# Patient Record
Sex: Female | Born: 1945 | Race: Black or African American | Hispanic: No | Marital: Single | Smoking: Never smoker
Health system: Southern US, Community
[De-identification: ages and names within clinical notes are randomized; demographics above are authoritative.]

## PROBLEM LIST (undated history)

## (undated) DIAGNOSIS — M869 Osteomyelitis, unspecified: Secondary | ICD-10-CM

## (undated) DIAGNOSIS — E079 Disorder of thyroid, unspecified: Secondary | ICD-10-CM

## (undated) HISTORY — PX: GASTRIC BYPASS: SHX52

## (undated) HISTORY — PX: FRACTURE SURGERY: SHX138

## (undated) HISTORY — PX: TUBAL LIGATION: SHX77

---

## 2002-01-13 ENCOUNTER — Encounter: Admission: RE | Admit: 2002-01-13 | Discharge: 2002-04-13 | Payer: Self-pay

## 2002-04-24 ENCOUNTER — Encounter: Admission: RE | Admit: 2002-04-24 | Discharge: 2002-07-23 | Payer: Self-pay

## 2002-08-14 ENCOUNTER — Encounter: Admission: RE | Admit: 2002-08-14 | Discharge: 2002-11-12 | Payer: Self-pay

## 2002-11-18 ENCOUNTER — Encounter: Admission: RE | Admit: 2002-11-18 | Discharge: 2003-02-16 | Payer: Self-pay

## 2004-09-05 ENCOUNTER — Ambulatory Visit: Payer: Self-pay | Admitting: Anesthesiology

## 2004-10-11 ENCOUNTER — Ambulatory Visit: Payer: Self-pay | Admitting: Anesthesiology

## 2004-11-16 ENCOUNTER — Ambulatory Visit: Payer: Self-pay | Admitting: Anesthesiology

## 2004-12-14 ENCOUNTER — Ambulatory Visit: Payer: Self-pay | Admitting: Anesthesiology

## 2005-01-03 ENCOUNTER — Ambulatory Visit: Payer: Self-pay | Admitting: Anesthesiology

## 2005-02-07 ENCOUNTER — Ambulatory Visit: Payer: Self-pay | Admitting: Anesthesiology

## 2005-03-08 ENCOUNTER — Ambulatory Visit: Payer: Self-pay | Admitting: Anesthesiology

## 2005-04-02 ENCOUNTER — Ambulatory Visit: Payer: Self-pay | Admitting: Anesthesiology

## 2005-05-01 ENCOUNTER — Ambulatory Visit: Payer: Self-pay | Admitting: Anesthesiology

## 2005-05-03 ENCOUNTER — Ambulatory Visit: Payer: Self-pay | Admitting: Obstetrics and Gynecology

## 2005-05-29 ENCOUNTER — Ambulatory Visit: Payer: Self-pay | Admitting: Physician Assistant

## 2005-07-04 ENCOUNTER — Ambulatory Visit: Payer: Self-pay | Admitting: Physician Assistant

## 2005-08-06 ENCOUNTER — Ambulatory Visit: Payer: Self-pay | Admitting: Physician Assistant

## 2005-09-05 ENCOUNTER — Ambulatory Visit: Payer: Self-pay | Admitting: Physician Assistant

## 2005-09-19 ENCOUNTER — Ambulatory Visit: Payer: Self-pay | Admitting: Internal Medicine

## 2005-10-01 ENCOUNTER — Ambulatory Visit: Payer: Self-pay | Admitting: Physician Assistant

## 2005-11-01 ENCOUNTER — Ambulatory Visit: Payer: Self-pay | Admitting: Physician Assistant

## 2005-12-04 ENCOUNTER — Ambulatory Visit: Payer: Self-pay | Admitting: Physician Assistant

## 2006-01-03 ENCOUNTER — Ambulatory Visit: Payer: Self-pay | Admitting: Physician Assistant

## 2006-01-31 ENCOUNTER — Ambulatory Visit: Payer: Self-pay | Admitting: Physician Assistant

## 2006-02-28 ENCOUNTER — Ambulatory Visit: Payer: Self-pay | Admitting: Physician Assistant

## 2006-04-01 ENCOUNTER — Ambulatory Visit: Payer: Self-pay | Admitting: Physician Assistant

## 2006-04-30 ENCOUNTER — Ambulatory Visit: Payer: Self-pay | Admitting: Physician Assistant

## 2006-05-30 ENCOUNTER — Ambulatory Visit: Payer: Self-pay | Admitting: Physician Assistant

## 2006-06-21 ENCOUNTER — Ambulatory Visit: Payer: Self-pay | Admitting: Obstetrics and Gynecology

## 2006-07-01 ENCOUNTER — Ambulatory Visit: Payer: Self-pay | Admitting: Physician Assistant

## 2006-07-31 ENCOUNTER — Ambulatory Visit: Payer: Self-pay | Admitting: Physician Assistant

## 2006-08-28 ENCOUNTER — Ambulatory Visit: Payer: Self-pay | Admitting: Physician Assistant

## 2006-09-25 ENCOUNTER — Ambulatory Visit: Payer: Self-pay | Admitting: Physician Assistant

## 2006-11-14 ENCOUNTER — Ambulatory Visit: Payer: Self-pay | Admitting: Physician Assistant

## 2006-12-16 ENCOUNTER — Ambulatory Visit: Payer: Self-pay | Admitting: Physician Assistant

## 2007-01-13 ENCOUNTER — Ambulatory Visit: Payer: Self-pay | Admitting: Physician Assistant

## 2007-02-13 ENCOUNTER — Ambulatory Visit: Payer: Self-pay | Admitting: Physician Assistant

## 2007-03-17 ENCOUNTER — Ambulatory Visit: Payer: Self-pay | Admitting: Physician Assistant

## 2007-04-16 ENCOUNTER — Ambulatory Visit: Payer: Self-pay | Admitting: Physician Assistant

## 2007-05-07 ENCOUNTER — Ambulatory Visit: Payer: Self-pay | Admitting: Pain Medicine

## 2007-06-04 ENCOUNTER — Ambulatory Visit: Payer: Self-pay | Admitting: Pain Medicine

## 2007-07-03 ENCOUNTER — Ambulatory Visit: Payer: Self-pay | Admitting: Obstetrics and Gynecology

## 2007-07-10 ENCOUNTER — Ambulatory Visit: Payer: Self-pay | Admitting: Physician Assistant

## 2007-08-18 ENCOUNTER — Ambulatory Visit: Payer: Self-pay | Admitting: Physician Assistant

## 2007-09-16 ENCOUNTER — Ambulatory Visit: Payer: Self-pay | Admitting: Physician Assistant

## 2007-10-13 ENCOUNTER — Ambulatory Visit: Payer: Self-pay | Admitting: Physician Assistant

## 2007-11-11 ENCOUNTER — Ambulatory Visit: Payer: Self-pay | Admitting: Physician Assistant

## 2007-12-16 ENCOUNTER — Ambulatory Visit: Payer: Self-pay | Admitting: Physician Assistant

## 2008-03-11 ENCOUNTER — Ambulatory Visit: Payer: Self-pay | Admitting: Physician Assistant

## 2008-06-08 ENCOUNTER — Ambulatory Visit: Payer: Self-pay | Admitting: Physician Assistant

## 2008-07-13 ENCOUNTER — Ambulatory Visit: Payer: Self-pay | Admitting: Physician Assistant

## 2008-08-13 ENCOUNTER — Ambulatory Visit: Payer: Self-pay | Admitting: Obstetrics and Gynecology

## 2008-08-24 ENCOUNTER — Ambulatory Visit: Payer: Self-pay | Admitting: Physician Assistant

## 2009-04-19 ENCOUNTER — Ambulatory Visit: Payer: Self-pay | Admitting: Physician Assistant

## 2009-09-05 ENCOUNTER — Ambulatory Visit: Payer: Self-pay | Admitting: Unknown Physician Specialty

## 2009-09-09 ENCOUNTER — Ambulatory Visit: Payer: Self-pay | Admitting: Family Medicine

## 2009-09-15 ENCOUNTER — Ambulatory Visit: Payer: Self-pay | Admitting: Family Medicine

## 2009-10-13 ENCOUNTER — Ambulatory Visit: Payer: Self-pay | Admitting: Physician Assistant

## 2010-01-31 ENCOUNTER — Encounter: Payer: Self-pay | Admitting: Family Medicine

## 2010-02-10 ENCOUNTER — Encounter: Payer: Self-pay | Admitting: Family Medicine

## 2010-03-27 ENCOUNTER — Encounter: Payer: Self-pay | Admitting: Family Medicine

## 2010-03-29 ENCOUNTER — Ambulatory Visit: Payer: Self-pay | Admitting: Family Medicine

## 2010-08-10 ENCOUNTER — Ambulatory Visit: Payer: Self-pay | Admitting: Bariatrics

## 2010-08-11 ENCOUNTER — Ambulatory Visit: Payer: Self-pay | Admitting: Bariatrics

## 2010-08-12 ENCOUNTER — Ambulatory Visit: Payer: Self-pay | Admitting: Bariatrics

## 2010-09-19 ENCOUNTER — Ambulatory Visit: Payer: Self-pay | Admitting: Bariatrics

## 2011-03-29 ENCOUNTER — Ambulatory Visit: Payer: Self-pay | Admitting: Family Medicine

## 2011-04-12 ENCOUNTER — Ambulatory Visit: Payer: Self-pay | Admitting: Bariatrics

## 2011-07-18 ENCOUNTER — Other Ambulatory Visit: Payer: Self-pay | Admitting: Bariatrics

## 2012-04-04 ENCOUNTER — Ambulatory Visit: Payer: Self-pay | Admitting: Family Medicine

## 2012-04-05 ENCOUNTER — Ambulatory Visit: Payer: Self-pay | Admitting: Family Medicine

## 2012-08-13 ENCOUNTER — Other Ambulatory Visit: Payer: Self-pay | Admitting: Bariatrics

## 2012-08-13 LAB — COMPREHENSIVE METABOLIC PANEL
Albumin: 3.6 g/dL (ref 3.4–5.0)
Anion Gap: 5 — ABNORMAL LOW (ref 7–16)
BUN: 19 mg/dL — ABNORMAL HIGH (ref 7–18)
Bilirubin,Total: 0.4 mg/dL (ref 0.2–1.0)
Calcium, Total: 9.2 mg/dL (ref 8.5–10.1)
Co2: 31 mmol/L (ref 21–32)
EGFR (African American): >60
EGFR (Non-African Amer.): >60
Glucose: 79 mg/dL (ref 65–99)
Osmolality: 286 (ref 275–301)
Potassium: 3.9 mmol/L (ref 3.5–5.1)
SGOT(AST): 32 U/L (ref 15–37)
Total Protein: 7.6 g/dL (ref 6.4–8.2)

## 2012-08-13 LAB — CBC WITH DIFFERENTIAL/PLATELET
Basophil #: 0 10*3/uL (ref 0.0–0.1)
Basophil %: 0.5 %
Eosinophil %: 3.1 %
HGB: 13.1 g/dL (ref 12.0–16.0)
Lymphocyte %: 32.1 %
MCH: 30.3 pg (ref 26.0–34.0)
Monocyte #: 0.6 x10 3/mm (ref 0.2–0.9)
Monocyte %: 8.1 %
Neutrophil %: 56.2 %
Platelet: 255 10*3/uL (ref 150–440)
RBC: 4.33 10*6/uL (ref 3.80–5.20)
WBC: 7.5 10*3/uL (ref 3.6–11.0)

## 2012-08-13 LAB — IRON: Iron: 101 ug/dL (ref 50–170)

## 2012-08-13 LAB — AMYLASE: Amylase: 53 U/L (ref 25–115)

## 2012-08-13 LAB — FOLATE: Folic Acid: 30.6 ng/mL (ref 3.1–100.0)

## 2012-08-13 LAB — FERRITIN: Ferritin (ARMC): 74 ng/mL (ref 8–388)

## 2012-12-05 ENCOUNTER — Ambulatory Visit: Payer: Self-pay | Admitting: Neurology

## 2013-04-07 ENCOUNTER — Ambulatory Visit: Payer: Self-pay | Admitting: Family Medicine

## 2013-04-13 ENCOUNTER — Ambulatory Visit: Payer: Self-pay | Admitting: Family Medicine

## 2014-05-18 ENCOUNTER — Ambulatory Visit: Payer: Self-pay | Admitting: Family Medicine

## 2014-09-20 ENCOUNTER — Ambulatory Visit: Payer: Self-pay | Admitting: Unknown Physician Specialty

## 2015-03-24 DIAGNOSIS — Z9884 Bariatric surgery status: Secondary | ICD-10-CM | POA: Insufficient documentation

## 2015-09-28 ENCOUNTER — Other Ambulatory Visit: Payer: Self-pay | Admitting: Family Medicine

## 2015-09-28 DIAGNOSIS — Z1231 Encounter for screening mammogram for malignant neoplasm of breast: Secondary | ICD-10-CM

## 2015-10-12 ENCOUNTER — Ambulatory Visit
Admission: RE | Admit: 2015-10-12 | Discharge: 2015-10-12 | Disposition: A | Discharge status: Home / Self Care | Payer: Medicare Other | Source: Ambulatory Visit | Attending: Family Medicine | Admitting: Family Medicine

## 2015-10-12 ENCOUNTER — Other Ambulatory Visit: Payer: Self-pay | Admitting: Family Medicine

## 2015-10-12 DIAGNOSIS — Z1231 Encounter for screening mammogram for malignant neoplasm of breast: Secondary | ICD-10-CM

## 2016-02-07 DIAGNOSIS — M868X6 Other osteomyelitis, lower leg: Secondary | ICD-10-CM | POA: Insufficient documentation

## 2016-09-03 ENCOUNTER — Other Ambulatory Visit: Payer: Self-pay | Admitting: Family Medicine

## 2016-09-03 DIAGNOSIS — Z1231 Encounter for screening mammogram for malignant neoplasm of breast: Secondary | ICD-10-CM

## 2016-10-12 ENCOUNTER — Ambulatory Visit
Admission: RE | Admit: 2016-10-12 | Discharge: 2016-10-12 | Disposition: A | Discharge status: Home / Self Care | Payer: Medicare Other | Source: Ambulatory Visit | Attending: Family Medicine | Admitting: Family Medicine

## 2016-10-12 DIAGNOSIS — Z1231 Encounter for screening mammogram for malignant neoplasm of breast: Secondary | ICD-10-CM | POA: Diagnosis not present

## 2016-10-17 DIAGNOSIS — E039 Hypothyroidism, unspecified: Secondary | ICD-10-CM | POA: Insufficient documentation

## 2017-05-13 ENCOUNTER — Encounter: Payer: Self-pay | Admitting: *Deleted

## 2017-05-13 ENCOUNTER — Ambulatory Visit
Admission: EM | Admit: 2017-05-13 | Discharge: 2017-05-13 | Disposition: A | Discharge status: Home / Self Care | Payer: Medicare Other | Attending: Family Medicine | Admitting: Family Medicine

## 2017-05-13 DIAGNOSIS — B356 Tinea cruris: Secondary | ICD-10-CM | POA: Diagnosis not present

## 2017-05-13 DIAGNOSIS — M79601 Pain in right arm: Secondary | ICD-10-CM

## 2017-05-13 DIAGNOSIS — I4510 Unspecified right bundle-branch block: Secondary | ICD-10-CM | POA: Diagnosis not present

## 2017-05-13 DIAGNOSIS — M549 Dorsalgia, unspecified: Secondary | ICD-10-CM

## 2017-05-13 DIAGNOSIS — R079 Chest pain, unspecified: Secondary | ICD-10-CM | POA: Diagnosis present

## 2017-05-13 HISTORY — DX: Osteomyelitis, unspecified: M86.9

## 2017-05-13 HISTORY — DX: Disorder of thyroid, unspecified: E07.9

## 2017-05-13 MED ORDER — CYCLOBENZAPRINE HCL 5 MG PO TABS: 5.0000 mg | ORAL_TABLET | Freq: Every day | ORAL | 0 refills | Status: AC

## 2017-05-13 NOTE — ED Triage Notes (Signed)
While on a cruise last week, pt onset lower neck, upper back pain that radiates to right arm and sometimes right chest. Pt was seen at the clinic onboard the ship and was advised by the physician to take aleve. Pt refused and EKG at that time. At present pt c/o neck, back pain radiating to right chest and arm. Denies other symptoms. Pain is unchanged with activity, exertion, movement or resp. No previous hx.

## 2017-05-13 NOTE — Discharge Instructions (Signed)
Heat, massage and stretch to right upper back area  Over the counter antifungal (ie Lamisil, Tinactin) powder to rash twice a day

## 2017-05-20 NOTE — ED Provider Notes (Signed)
[facility]    CSN: [id number] Arrival date & time: 05/13/17  1439     History   Chief Complaint Chief Complaint  Patient presents with  . Back Pain  . Chest Pain  . Arm Pain    HPI Tracie Gibson is a 71 y.o. female.   71 yo female with approximately 2 weeks h/o intermittent right upper back and right arm pain. States occasionally radiates to the right chest area. Denies any injuries, fevers, chills, cough, rash, nausea, neck/jaw pain, shortness of breath.  Also c/o itchy rash to groin area.    The history is provided by the patient.  Back Pain  Associated symptoms: chest pain   Chest Pain  Associated symptoms: back pain   Arm Pain  Associated symptoms include chest pain.    Past Medical History:  Diagnosis Date  . Osteomyelitis (HCC)    left leg  . Thyroid disease     There are no active problems to display for this patient.   Past Surgical History:  Procedure Laterality Date  . FRACTURE SURGERY    . TUBAL LIGATION      OB History    No data available       Home Medications    Prior to Admission medications   Medication Sig Start Date End Date Taking? Authorizing Provider  aspirin EC 81 MG tablet Take 81 mg by mouth daily.   Yes [provider]  calcium carbonate (OS-CAL - DOSED IN MG OF ELEMENTAL CALCIUM) 1250 (500 Ca) MG tablet Take 1 tablet by mouth.   Yes [provider]  cholecalciferol (VITAMIN D) 1000 units tablet Take 1,000 Units by mouth daily.   Yes [provider]  cyanocobalamin 2000 MCG tablet Take 2,000 mcg by mouth daily.   Yes [provider]  ferrous sulfate 325 (65 FE) MG EC tablet Take 325 mg by mouth 3 (three) times daily with meals.   Yes [provider]  ibandronate (BONIVA) 150 MG tablet Take 150 mg by mouth every 30 (thirty) days. Take in the morning with a full glass of water, on an empty stomach, and do not take anything else by mouth or lie down for the next 30 min.    Yes [provider]  Boris Lown Oil 300 MG CAPS Take by mouth.   Yes [provider]  levothyroxine (SYNTHROID, LEVOTHROID) 50 MCG tablet Take 50 mcg by mouth daily before breakfast.   Yes [provider]  cyclobenzaprine (FLEXERIL) 5 MG tablet Take 1 tablet (5 mg total) by mouth at bedtime. 05/13/17   Payton Mccallum, MD    Family History History reviewed. No pertinent family history.  Social History Social History  Substance Use Topics  . Smoking status: Never Smoker  . Smokeless tobacco: Never Used  . Alcohol use No     Allergies   Patient has no known allergies.   Review of Systems Review of Systems  Cardiovascular: Positive for chest pain.  Musculoskeletal: Positive for back pain.     Physical Exam Triage Vital Signs ED Triage Vitals  Enc Vitals Group     BP 05/13/17 1536 110/77     Pulse Rate 05/13/17 1536 64     Resp 05/13/17 1536 16     Temp 05/13/17 1536 97.7 F (36.5 C)     Temp Source 05/13/17 1536 Oral     SpO2 05/13/17 1536 99 %     Weight 05/13/17 1538 170 lb (77.1 kg)  Height 05/13/17 1538 5\' 5"  (1.651 m)     Head Circumference --      Peak Flow --      Pain Score 05/13/17 1539 5     Pain Loc --      Pain Edu? --      Excl. in GC? --    No data found.   Updated Vital Signs BP 110/77 (BP Location: Left Arm)   Pulse 64   Temp 97.7 F (36.5 C) (Oral)   Resp 16   Ht 5\' 5"  (1.651 m)   Wt 170 lb (77.1 kg)   SpO2 99%   BMI 28.29 kg/m   Visual Acuity Right Eye Distance:   Left Eye Distance:   Bilateral Distance:    Right Eye Near:   Left Eye Near:    Bilateral Near:     Physical Exam  Constitutional: She appears well-developed and well-nourished. No distress.  HENT:  Head: Normocephalic and atraumatic.  Right Ear: Tympanic membrane, external ear and ear canal normal.  Left Ear: Tympanic membrane, external ear and ear canal normal.  Nose: No mucosal edema, rhinorrhea, nose lacerations, sinus tenderness, nasal  deformity, septal deviation or nasal septal hematoma. No epistaxis.  No foreign bodies. Right sinus exhibits no maxillary sinus tenderness and no frontal sinus tenderness. Left sinus exhibits no maxillary sinus tenderness and no frontal sinus tenderness.  Mouth/Throat: Uvula is midline, oropharynx is clear and moist and mucous membranes are normal. No oropharyngeal exudate.  Eyes: Conjunctivae and EOM are normal. Pupils are equal, round, and reactive to light. Right eye exhibits no discharge. Left eye exhibits no discharge. No scleral icterus.  Neck: Normal range of motion. Neck supple. No thyromegaly present.  Cardiovascular: Normal rate, regular rhythm and normal heart sounds.   Pulmonary/Chest: Effort normal and breath sounds normal. No respiratory distress. She has no wheezes. She has no rales. She exhibits no tenderness.  Musculoskeletal: She exhibits tenderness (over the right trapezius muscle and deltoid).  Lymphadenopathy:    She has no cervical adenopathy.  Skin: She is not diaphoretic.  Nursing note and vitals reviewed.    UC Treatments / Results  Labs (all labs ordered are listed, but only abnormal results are displayed) Labs Reviewed - No data to display  EKG  EKG Interpretation None       Radiology No results found.  Procedures .EKG Date/Time: 05/20/2017 12:57 PM Performed by: Payton MccallumONTY, Roy Tokarz Authorized by: Payton MccallumONTY, Jaretzy Lhommedieu   ECG reviewed by ED Physician in the absence of a cardiologist: yes   Previous ECG:    Previous ECG:  Compared to current   Similarity:  No change Interpretation:    Interpretation: normal   Rate:    ECG rate assessment: normal   Rhythm:    Rhythm: sinus rhythm   Ectopy:    Ectopy: none   QRS:    QRS axis:  Normal Conduction:    Conduction: abnormal     Abnormal conduction: incomplete RBBB   ST segments:    ST segments:  Normal T waves:    T waves: normal     (including critical care time)  Medications Ordered in UC Medications  - No data to display   Initial Impression / Assessment and Plan / UC Course  I have reviewed the triage vital signs and the nursing notes.  Pertinent labs & imaging results that were available during my care of the patient were reviewed by me and considered in my medical decision making (see chart  for details).       Final Clinical Impressions(s) / UC Diagnoses   Final diagnoses:  Right arm pain  Upper back pain on right side  Tinea cruris    New Prescriptions Discharge Medication List as of 05/13/2017  4:31 PM    START taking these medications   Details  cyclobenzaprine (FLEXERIL) 5 MG tablet Take 1 tablet (5 mg total) by mouth at bedtime., Starting Mon 05/13/2017, Normal       1. ekg results and diagnosis reviewed with patient 2. rx as per orders above; reviewed possible side effects, interactions, risks and benefits  3. Recommend supportive treatment with rest, heat, stretching; otc antifungal for tinea cruris 4. Follow-up prn if symptoms worsen or don't improve   Payton Mccallum, MD 05/20/17 1304

## 2017-06-19 DIAGNOSIS — S46911A Strain of unspecified muscle, fascia and tendon at shoulder and upper arm level, right arm, initial encounter: Secondary | ICD-10-CM | POA: Insufficient documentation

## 2017-07-12 DIAGNOSIS — R809 Proteinuria, unspecified: Secondary | ICD-10-CM | POA: Insufficient documentation

## 2017-11-25 ENCOUNTER — Other Ambulatory Visit: Payer: Self-pay | Admitting: Family Medicine

## 2017-11-25 DIAGNOSIS — Z1231 Encounter for screening mammogram for malignant neoplasm of breast: Secondary | ICD-10-CM

## 2017-12-10 ENCOUNTER — Ambulatory Visit
Admission: RE | Admit: 2017-12-10 | Discharge: 2017-12-10 | Disposition: A | Discharge status: Home / Self Care | Payer: Medicare Other | Source: Ambulatory Visit | Attending: Family Medicine | Admitting: Family Medicine

## 2017-12-10 DIAGNOSIS — Z1231 Encounter for screening mammogram for malignant neoplasm of breast: Secondary | ICD-10-CM | POA: Insufficient documentation

## 2017-12-26 ENCOUNTER — Encounter: Payer: Self-pay | Admitting: Emergency Medicine

## 2017-12-26 ENCOUNTER — Emergency Department
Admission: EM | Admit: 2017-12-26 | Discharge: 2017-12-26 | Disposition: A | Discharge status: Home / Self Care | Payer: Medicare Other | Attending: Emergency Medicine | Admitting: Emergency Medicine

## 2017-12-26 ENCOUNTER — Other Ambulatory Visit: Payer: Self-pay

## 2017-12-26 DIAGNOSIS — G8929 Other chronic pain: Secondary | ICD-10-CM | POA: Diagnosis not present

## 2017-12-26 DIAGNOSIS — M25562 Pain in left knee: Secondary | ICD-10-CM | POA: Diagnosis present

## 2017-12-26 DIAGNOSIS — Z79899 Other long term (current) drug therapy: Secondary | ICD-10-CM | POA: Diagnosis not present

## 2017-12-26 DIAGNOSIS — Z7982 Long term (current) use of aspirin: Secondary | ICD-10-CM | POA: Diagnosis not present

## 2017-12-26 DIAGNOSIS — M25561 Pain in right knee: Secondary | ICD-10-CM | POA: Insufficient documentation

## 2017-12-26 MED ORDER — OXYCODONE HCL 5 MG PO TABS: 5.0000 mg | ORAL_TABLET | Freq: Three times a day (TID) | ORAL | 0 refills | Status: DC | PRN

## 2017-12-26 NOTE — ED Provider Notes (Signed)
[facility] Emergency Department Provider Note ____________________________________________  Time seen: Approximately 6:45 PM  I have reviewed the triage vital signs and the nursing notes.   HISTORY  Chief Complaint Knee Pain    HPI Tracie Gibson is a 72 y.o. female who presents to the emergency department for treatment and evaluation of bilateral knee pain.  Patient reports a long-standing history of chronic knee pain and states that she has been evaluated by multiple specialists who are unwilling to perform knee replacement surgeries.  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  She denies any recent injury.  She denies any recent changes in activity level, however states that both knees have been increasingly painful over the past week or so.  She states that she has taken Tylenol and Aleve PM without relief. She states that she used to go to pain management, but did not want to become addicted to narcotic medications so after her "drug holiday," she just decided to tolerate the pain.  Past Medical History:  Diagnosis Date  . Osteomyelitis (HCC)    left leg  . Thyroid disease     There are no active problems to display for this patient.   Past Surgical History:  Procedure Laterality Date  . FRACTURE SURGERY    . TUBAL LIGATION      Prior to Admission medications   Medication Sig Start Date End Date Taking? Authorizing Provider  aspirin EC 81 MG tablet Take 81 mg by mouth daily.    [provider]  calcium carbonate (OS-CAL - DOSED IN MG OF ELEMENTAL CALCIUM) 1250 (500 Ca) MG tablet Take 1 tablet by mouth.    [provider]  cholecalciferol (VITAMIN D) 1000 units tablet Take 1,000 Units by mouth daily.    [provider]  cyanocobalamin 2000 MCG tablet Take 2,000 mcg by mouth daily.    [provider]   cyclobenzaprine (FLEXERIL) 5 MG tablet Take 1 tablet (5 mg total) by mouth at bedtime. 05/13/17   Payton Mccallum, MD  ferrous sulfate 325 (65 FE) MG EC tablet Take 325 mg by mouth 3 (three) times daily with meals.    [provider]  ibandronate (BONIVA) 150 MG tablet Take 150 mg by mouth every 30 (thirty) days. Take in the morning with a full glass of water, on an empty stomach, and do not take anything else by mouth or lie down for the next 30 min.    [provider]  [place] Lanius 300 MG CAPS Take by mouth.    [provider]  levothyroxine (SYNTHROID, LEVOTHROID) 50 MCG tablet Take 50 mcg by mouth daily before breakfast.    [provider]  oxyCODONE (ROXICODONE) 5 MG immediate release tablet Take 1 tablet (5 mg total) by mouth every 8 (eight) hours as needed. 12/26/17 12/26/18  Chinita Pester, FNP    Allergies Patient has no known allergies.  Family History  Problem Relation Age of Onset  . Breast cancer Neg Hx     Social History Social History   Tobacco Use  . Smoking status: Never Smoker  . Smokeless tobacco: Never Used  Substance Use Topics  . Alcohol use: No  . Drug use: No    Review of Systems Constitutional: Negative for recent injury or fever. Cardiovascular: Negative for chest pain. Respiratory: Negative for shortness of breath Musculoskeletal: Positive for bilateral  knee pain. Skin: Negative for open wounds or lesions.  Negative for rash. Neurological: Negative for paresthesias.  ____________________________________________   PHYSICAL EXAM:  VITAL SIGNS: ED Triage Vitals  Enc Vitals Group     BP 12/26/17 1810 110/70     Pulse Rate 12/26/17 1810 62     Resp 12/26/17 1810 20     Temp 12/26/17 1810 98.7 F (37.1 C)     Temp Source 12/26/17 1810 Oral     SpO2 --      Weight 12/26/17 1810 170 lb (77.1 kg)     Height 12/26/17 1810 5\' 3"  (1.6 m)     Head Circumference --      Peak Flow --      Pain Score 12/26/17 1808 9      Pain Loc --      Pain Edu? --      Excl. in GC? --     Constitutional: Alert and oriented. Well appearing and in no acute distress. Eyes: Conjunctivae are clear without discharge or drainage Head: Atraumatic Neck: Supple Respiratory: Respirations are even and unlabored. Musculoskeletal: Left knee is greater in size than the right and per patient report appears normal to her.  No effusion.  She is able to demonstrate active, full range of motion of both knees.  Antalgic gait was observed. Neurologic: Motor and sensory function is intact. Skin: Intact over the knees and lower extremities. No erythema or wounds. Psychiatric: Affect and behavior are appropriate.  ____________________________________________   LABS (all labs ordered are listed, but only abnormal results are displayed)  Labs Reviewed - No data to display ____________________________________________  RADIOLOGY  Not indicated. ____________________________________________   PROCEDURES  Procedures  ____________________________________________   INITIAL IMPRESSION / ASSESSMENT AND PLAN / ED COURSE  Tracie Gibson is a 72 y.o. female who presents to the emergency department for evaluation and treatment of bilateral knee pain which is acute on chronic.  Patient states that this is her typical pain with the exception of intensity.  She does not recall doing anything different over the past few days.  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  Patient was encouraged to rest for the next few days and elevate her lower extremities.  She was given a prescription for 5 mg immediate release oxycodone and encouraged to mainly take it at night before bed.  She was advised that the medication may cause some dizziness and drowsiness.  Patient states that she has taken this medication in the past and has not caused excessive side effects.  She was encouraged to follow-up with her primary care provider if the  pain has not improved with rest and elevation as well as medication within the next few days.  She was advised to return to the emergency department for symptoms of change or worsen if she is unable to schedule an appointment.  Medications - No data to display  Pertinent labs & imaging results that were available during my care of the patient were reviewed by me and considered in my medical decision making (see chart for details).  _________________________________________   FINAL CLINICAL IMPRESSION(S) / ED DIAGNOSES  Final diagnoses:  Chronic pain of both knees    ED Discharge Orders        Ordered    oxyCODONE (ROXICODONE) 5 MG immediate release tablet  Every 8 hours PRN     12/26/17 1906       If controlled substance prescribed during this visit,  12 month history viewed on the [facility] prior to issuing an initial prescription for Schedule II or III opiod.    Chinita Pester, FNP 12/26/17 2220    Minna Antis, MD 12/26/17 [phone]

## 2017-12-26 NOTE — ED Triage Notes (Signed)
Presents with bilateral knee pain  States she thinks she did too much walking  Denies any injury

## 2018-01-15 ENCOUNTER — Ambulatory Visit
Admission: EM | Admit: 2018-01-15 | Discharge: 2018-01-15 | Disposition: A | Discharge status: Home / Self Care | Payer: Medicare Other | Attending: Family Medicine | Admitting: Family Medicine

## 2018-01-15 ENCOUNTER — Other Ambulatory Visit: Payer: Self-pay

## 2018-01-15 DIAGNOSIS — R112 Nausea with vomiting, unspecified: Secondary | ICD-10-CM

## 2018-01-15 LAB — COMPREHENSIVE METABOLIC PANEL
ALK PHOS: 70 U/L (ref 38–126)
ALT: 14 U/L (ref 14–54)
AST: 24 U/L (ref 15–41)
Albumin: 4.1 g/dL (ref 3.5–5.0)
Anion gap: 10 (ref 5–15)
BUN: 11 mg/dL (ref 6–20)
CALCIUM: 9.4 mg/dL (ref 8.9–10.3)
CHLORIDE: 100 mmol/L — AB (ref 101–111)
CO2: 28 mmol/L (ref 22–32)
CREATININE: 0.87 mg/dL (ref 0.44–1.00)
GFR calc non Af Amer: >60 mL/min (ref >60)
GLUCOSE: 89 mg/dL (ref 65–99)
Potassium: 4.2 mmol/L (ref 3.5–5.1)
SODIUM: 138 mmol/L (ref 135–145)
Total Bilirubin: 0.7 mg/dL (ref 0.3–1.2)
Total Protein: 8.3 g/dL — ABNORMAL HIGH (ref 6.5–8.1)

## 2018-01-15 LAB — LIPASE, BLOOD: Lipase: 21 U/L (ref 11–51)

## 2018-01-15 MED ORDER — ONDANSETRON 4 MG PO TBDP: 4.0000 mg | ORAL_TABLET | Freq: Three times a day (TID) | ORAL | 0 refills | Status: AC | PRN

## 2018-01-15 MED ORDER — ONDANSETRON HCL 4 MG/2ML IJ SOLN
8.0000 mg | Freq: Once | INTRAMUSCULAR | Status: AC
  Administered 2018-01-15: 8 mg via INTRAMUSCULAR

## 2018-01-15 MED ORDER — SODIUM CHLORIDE 0.9 % IV BOLUS (SEPSIS)
1000.0000 mL | Freq: Once | INTRAVENOUS | Status: AC
  Administered 2018-01-15: 1000 mL via INTRAVENOUS

## 2018-01-15 NOTE — ED Provider Notes (Signed)
[facility]    CSN: [id number] Arrival date & time: 01/15/18  1027  History   Chief Complaint Chief Complaint  Patient presents with  . Emesis   HPI  72 year old female presents with nausea and vomiting.  Patient reports a 2-day history of nausea and vomiting.  She states that it occurred after she ate some blueberries.  She states that she has been unable to keep anything down.  She is drank very little as she throws everything back up.  She has a history abdominal surgery including gastric bypass.  No reports of fever.  No reports of abdominal pain.  She does state that her abdomen is sore from the vomiting.  She did have a bowel movement yesterday.  No known exacerbating factors.  No other associated symptoms.  No other complaints.  Past Medical History:  Diagnosis Date  . Osteomyelitis (HCC)    left leg  . Thyroid disease    Past Surgical History:  Procedure Laterality Date  . FRACTURE SURGERY    . TUBAL LIGATION    Gastric bypass  OB History    No data available     Home Medications    Prior to Admission medications   Medication Sig Start Date End Date Taking? Authorizing Provider  aspirin EC 81 MG tablet Take 81 mg by mouth daily.   Yes [provider]  calcium carbonate (OS-CAL - DOSED IN MG OF ELEMENTAL CALCIUM) 1250 (500 Ca) MG tablet Take 1 tablet by mouth.   Yes [provider]  cholecalciferol (VITAMIN D) 1000 units tablet Take 1,000 Units by mouth daily.   Yes [provider]  cyanocobalamin 2000 MCG tablet Take 2,000 mcg by mouth daily.   Yes [provider]  cyclobenzaprine (FLEXERIL) 5 MG tablet Take 1 tablet (5 mg total) by mouth at bedtime. 05/13/17  Yes Payton Mccallum, MD  ferrous sulfate 325 (65 FE) MG EC tablet Take 325 mg by mouth 3 (three) times daily with meals.   Yes [provider]  Boris Lown Oil 300 MG CAPS Take by mouth.   Yes [provider]  levothyroxine (SYNTHROID, LEVOTHROID) 50  MCG tablet Take 50 mcg by mouth daily before breakfast.   Yes [provider]  ondansetron (ZOFRAN-ODT) 4 MG disintegrating tablet Take 1 tablet (4 mg total) by mouth every 8 (eight) hours as needed for nausea or vomiting. 01/15/18   Tommie Sams, DO    Family History Family History  Problem Relation Age of Onset  . Arthritis Mother   . Alzheimer's disease Father   . Breast cancer Neg Hx     Social History Social History   Tobacco Use  . Smoking status: Never Smoker  . Smokeless tobacco: Never Used  Substance Use Topics  . Alcohol use: No  . Drug use: No     Allergies   Patient has no known allergies.   Review of Systems Review of Systems  Constitutional: Positive for appetite change. Negative for fever.  Gastrointestinal: Positive for nausea and vomiting.   Physical Exam Triage Vital Signs ED Triage Vitals  Enc Vitals Group     BP 01/15/18 0841 127/68     Pulse Rate 01/15/18 0841 66     Resp 01/15/18 0841 18     Temp 01/15/18 0841 98.5 F (36.9 C)     Temp Source 01/15/18 0841 Oral     SpO2 01/15/18 0841 100 %     Weight 01/15/18 0838 170 lb (77.1 kg)  Height 01/15/18 0838 5\' 3"  (1.6 m)     Head Circumference --      Peak Flow --      Pain Score 01/15/18 0838 0     Pain Loc --      Pain Edu? --      Excl. in GC? --    Updated Vital Signs BP 127/68 (BP Location: Left Arm)   Pulse 66   Temp 99.1 F (37.3 C) (Oral)   Resp 18   Ht 5\' 3"  (1.6 m)   Wt 170 lb (77.1 kg)   SpO2 100%   BMI 30.11 kg/m     Physical Exam  Constitutional: She is oriented to person, place, and time. She appears well-developed. No distress.  HENT:  Dry mucous membranes.  Eyes: Conjunctivae are normal. Right eye exhibits no discharge. Left eye exhibits no discharge. No scleral icterus.  Cardiovascular: Normal rate and regular rhythm.  Pulmonary/Chest: Effort normal and breath sounds normal.  Abdominal: Soft. She exhibits no distension. There is no tenderness.  +  bowel sounds.  Neurological: She is alert and oriented to person, place, and time.  Psychiatric: She has a normal mood and affect. Her behavior is normal.  Nursing note and vitals reviewed.  UC Treatments / Results  Labs (all labs ordered are listed, but only abnormal results are displayed) Labs Reviewed  COMPREHENSIVE METABOLIC PANEL - Abnormal; Notable for the following components:      Result Value   Chloride 100 (*)    Total Protein 8.3 (*)    All other components within normal limits  LIPASE, BLOOD    EKG  EKG Interpretation None       Radiology No results found.  Procedures Procedures (including critical care time)  Medications Ordered in UC Medications  ondansetron (ZOFRAN) injection 8 mg (8 mg Intramuscular Given 01/15/18 0940)  sodium chloride 0.9 % bolus 1,000 mL (0 mLs Intravenous Stopped 01/15/18 1048)     Initial Impression / Assessment and Plan / UC Course  I have reviewed the triage vital signs and the nursing notes.  Pertinent labs & imaging results that were available during my care of the patient were reviewed by me and considered in my medical decision making (see chart for details).    72 year old female presents with intractable nausea and vomiting.  No evidence of ileus/obstruction.  Laboratory studies unremarkable.  IV fluids given today as well as IV Zofran.  Patient improved here.  She was doing well at the time of discharge.  I advised aggressive hydration and Zofran as needed.  If she fails to improve or worsens, she should go to the hospital for further management.  Final Clinical Impressions(s) / UC Diagnoses   Final diagnoses:  Intractable vomiting with nausea, unspecified vomiting type    ED Discharge Orders        Ordered    ondansetron (ZOFRAN-ODT) 4 MG disintegrating tablet  Every 8 hours PRN     01/15/18 1050     Controlled Substance Prescriptions [place] Controlled Substance Registry consulted? Not Applicable   Tommie SamsCook, Taimi Towe G,  DO 01/15/18 1054

## 2018-01-15 NOTE — Discharge Instructions (Signed)
Rest.  Lots and lots of fluids.  If you continue to be unable to keep things down, go to the hospital.  Take care  Dr. Adriana Simasook

## 2018-01-15 NOTE — ED Triage Notes (Signed)
Patient states that had blueberries two nights ago. Shortly afterwards she started vomiting. Patient reports that she has been unable to keep anything down. Patient states that she had gastric bypass 5-6 years ago. Patient states that she has been having dry heaves.

## 2018-01-17 ENCOUNTER — Ambulatory Visit (INDEPENDENT_AMBULATORY_CARE_PROVIDER_SITE_OTHER): Payer: Medicare Other

## 2018-01-17 ENCOUNTER — Ambulatory Visit
Admission: EM | Admit: 2018-01-17 | Discharge: 2018-01-17 | Disposition: A | Discharge status: Home / Self Care | Payer: Medicare Other | Attending: Family Medicine | Admitting: Family Medicine

## 2018-01-17 ENCOUNTER — Encounter: Payer: Self-pay | Admitting: *Deleted

## 2018-01-17 ENCOUNTER — Other Ambulatory Visit: Payer: Self-pay

## 2018-01-17 DIAGNOSIS — R112 Nausea with vomiting, unspecified: Secondary | ICD-10-CM | POA: Diagnosis not present

## 2018-01-17 DIAGNOSIS — R1030 Lower abdominal pain, unspecified: Secondary | ICD-10-CM | POA: Diagnosis not present

## 2018-01-17 DIAGNOSIS — N3001 Acute cystitis with hematuria: Secondary | ICD-10-CM | POA: Diagnosis not present

## 2018-01-17 LAB — URINALYSIS, COMPLETE (UACMP) WITH MICROSCOPIC
Glucose, UA: NEGATIVE mg/dL
Ketones, ur: 40 mg/dL — AB
NITRITE: POSITIVE — AB
PH: 5.5 (ref 5.0–8.0)
Protein, ur: NEGATIVE mg/dL
SPECIFIC GRAVITY, URINE: 1.02 (ref 1.005–1.030)

## 2018-01-17 MED ORDER — PROMETHAZINE HCL 12.5 MG RE SUPP: 12.5000 mg | Freq: Three times a day (TID) | RECTAL | 0 refills | Status: AC | PRN

## 2018-01-17 MED ORDER — PROMETHAZINE HCL 12.5 MG PO TABS: ORAL_TABLET | ORAL | 0 refills | Status: AC

## 2018-01-17 MED ORDER — SULFAMETHOXAZOLE-TRIMETHOPRIM 800-160 MG PO TABS: 1.0000 | ORAL_TABLET | Freq: Two times a day (BID) | ORAL | 0 refills | Status: DC

## 2018-01-17 NOTE — ED Provider Notes (Signed)
[facility]    CSN: [id number] Arrival date & time: 01/17/18  4098     History   Chief Complaint Chief Complaint  Patient presents with  . Nausea  . Emesis    HPI Tracie Gibson is a 72 y.o. female.   72 yo female with a c/o nausea and vomiting which has not improved since visit 2 days ago and associated with mild lower abdominal discomfort. Denies fevers, chills. States zofran is not helping.    The history is provided by the patient.  Emesis    Past Medical History:  Diagnosis Date  . Osteomyelitis (HCC)    left leg  . Thyroid disease     There are no active problems to display for this patient.   Past Surgical History:  Procedure Laterality Date  . FRACTURE SURGERY    . TUBAL LIGATION      OB History    No data available       Home Medications    Prior to Admission medications   Medication Sig Start Date End Date Taking? Authorizing Provider  aspirin EC 81 MG tablet Take 81 mg by mouth daily.    [provider]  calcium carbonate (OS-CAL - DOSED IN MG OF ELEMENTAL CALCIUM) 1250 (500 Ca) MG tablet Take 1 tablet by mouth.    [provider]  cholecalciferol (VITAMIN D) 1000 units tablet Take 1,000 Units by mouth daily.    [provider]  cyanocobalamin 2000 MCG tablet Take 2,000 mcg by mouth daily.    [provider]  cyclobenzaprine (FLEXERIL) 5 MG tablet Take 1 tablet (5 mg total) by mouth at bedtime. 05/13/17   Payton Mccallum, MD  ferrous sulfate 325 (65 FE) MG EC tablet Take 325 mg by mouth 3 (three) times daily with meals.    [provider]  [place] Lanius 300 MG CAPS Take by mouth.    [provider]  levothyroxine (SYNTHROID, LEVOTHROID) 50 MCG tablet Take 50 mcg by mouth daily before breakfast.    [provider]  ondansetron (ZOFRAN-ODT) 4 MG disintegrating tablet Take 1 tablet (4 mg total) by mouth every 8 (eight) hours as needed for nausea or vomiting. 01/15/18   Tommie Sams, DO  promethazine (PHENERGAN) 12.5 MG suppository Place 1 suppository (12.5 mg total) rectally every 8 (eight) hours as needed for nausea or vomiting. 01/17/18   Payton Mccallum, MD  promethazine (PHENERGAN) 12.5 MG tablet 1-2 tabs po q 8 hours prn 01/17/18   Payton Mccallum, MD  sulfamethoxazole-trimethoprim (BACTRIM DS,SEPTRA DS) 800-160 MG tablet Take 1 tablet by mouth 2 (two) times daily. 01/17/18   Payton Mccallum, MD    Family History Family History  Problem Relation Age of Onset  . Arthritis Mother   . Alzheimer's disease Father   . Breast cancer Neg Hx     Social History Social History   Tobacco Use  . Smoking status: Never Smoker  . Smokeless tobacco: Never Used  Substance Use Topics  . Alcohol use: No  . Drug use: No     Allergies   Other   Review of Systems Review of Systems  Gastrointestinal: Positive for vomiting.     Physical Exam Triage Vital Signs ED Triage Vitals  Enc Vitals Group     BP 01/17/18 0858 110/60     Pulse Rate 01/17/18 0858 72     Resp 01/17/18 0858 16     Temp 01/17/18 0858 98.5 F (36.9 C)  Temp Source 01/17/18 0858 Oral     SpO2 01/17/18 0858 100 %     Weight --      Height --      Head Circumference --      Peak Flow --      Pain Score 01/17/18 0859 0     Pain Loc --      Pain Edu? --      Excl. in GC? --    No data found.  Updated Vital Signs BP 110/60 (BP Location: Left Arm)   Pulse 72   Temp 98.5 F (36.9 C) (Oral)   Resp 16   SpO2 100%   Visual Acuity Right Eye Distance:   Left Eye Distance:   Bilateral Distance:    Right Eye Near:   Left Eye Near:    Bilateral Near:     Physical Exam  Constitutional: She appears well-developed and well-nourished. No distress.  Abdominal: Soft. Bowel sounds are normal. She exhibits no distension and no mass. There is tenderness (mild, lower, suprapubic). There is no rebound and no guarding.  Skin: She is not diaphoretic.  Nursing note and vitals reviewed.    UC  Treatments / Results  Labs (all labs ordered are listed, but only abnormal results are displayed) Labs Reviewed  URINALYSIS, COMPLETE (UACMP) WITH MICROSCOPIC - Abnormal; Notable for the following components:      Result Value   APPearance CLOUDY (*)    Hgb urine dipstick TRACE (*)    Bilirubin Urine SMALL (*)    Ketones, ur 40 (*)    Nitrite POSITIVE (*)    Leukocytes, UA MODERATE (*)    Squamous Epithelial / LPF 6-30 (*)    Bacteria, UA FEW (*)    All other components within normal limits  URINE CULTURE    EKG  EKG Interpretation None       Radiology Dg Abd 2 Views  Result Date: 01/17/2018 CLINICAL DATA:  Nausea and vomiting for several days EXAM: ABDOMEN - 2 VIEW COMPARISON:  None. FINDINGS: Scattered large and small bowel gas is noted. No free air is seen. Postsurgical changes in the stomach are noted. Degenerative changes of lumbar spine are noted with mild scoliosis concave to the right. No other focal abnormality is noted. IMPRESSION: No acute abnormality noted. Electronically Signed   By: Alcide CleverMark  Lukens M.D.   On: 01/17/2018 10:12    Procedures Procedures (including critical care time)  Medications Ordered in UC Medications - No data to display   Initial Impression / Assessment and Plan / UC Course  I have reviewed the triage vital signs and the nursing notes.  Pertinent labs & imaging results that were available during my care of the patient were reviewed by me and considered in my medical decision making (see chart for details).      Final Clinical Impressions(s) / UC Diagnoses   Final diagnoses:  Acute cystitis with hematuria  Intractable vomiting with nausea, unspecified vomiting type    ED Discharge Orders        Ordered    promethazine (PHENERGAN) 12.5 MG tablet     01/17/18 1032    sulfamethoxazole-trimethoprim (BACTRIM DS,SEPTRA DS) 800-160 MG tablet  2 times daily     01/17/18 1032    promethazine (PHENERGAN) 12.5 MG suppository  Every 8 hours  PRN     01/17/18 1032     1. Labs/x-ray results and diagnosis reviewed with patient 2. rx as per orders above; reviewed possible side  effects, interactions, risks and benefits  3. Recommend supportive treatment with increased liquids, otc analgesics prn 4. Follow-up prn if symptoms worsen or don't improve  Controlled Substance Prescriptions [place] Controlled Substance Registry consulted? Not Applicable   Payton Mccallum, MD 01/17/18 2011

## 2018-01-17 NOTE — ED Triage Notes (Signed)
PAtient is still having symptoms of nausea and vomiting since last visit 01/15/18.

## 2018-01-18 LAB — URINE CULTURE

## 2018-01-20 ENCOUNTER — Other Ambulatory Visit: Payer: Self-pay

## 2018-01-20 ENCOUNTER — Inpatient Hospital Stay
Admission: EM | Admit: 2018-01-20 | Discharge: 2018-01-24 | DRG: 381 | Disposition: A | Discharge status: Home / Self Care | Payer: Medicare Other | Attending: Internal Medicine | Admitting: Internal Medicine

## 2018-01-20 DIAGNOSIS — Z6830 Body mass index (BMI) 30.0-30.9, adult: Secondary | ICD-10-CM

## 2018-01-20 DIAGNOSIS — K283 Acute gastrojejunal ulcer without hemorrhage or perforation: Principal | ICD-10-CM | POA: Diagnosis present

## 2018-01-20 DIAGNOSIS — E860 Dehydration: Secondary | ICD-10-CM | POA: Diagnosis not present

## 2018-01-20 DIAGNOSIS — Z79899 Other long term (current) drug therapy: Secondary | ICD-10-CM

## 2018-01-20 DIAGNOSIS — N179 Acute kidney failure, unspecified: Secondary | ICD-10-CM | POA: Diagnosis present

## 2018-01-20 DIAGNOSIS — N390 Urinary tract infection, site not specified: Secondary | ICD-10-CM | POA: Diagnosis present

## 2018-01-20 DIAGNOSIS — K08409 Partial loss of teeth, unspecified cause, unspecified class: Secondary | ICD-10-CM | POA: Diagnosis present

## 2018-01-20 DIAGNOSIS — Z9884 Bariatric surgery status: Secondary | ICD-10-CM | POA: Diagnosis not present

## 2018-01-20 DIAGNOSIS — Z7982 Long term (current) use of aspirin: Secondary | ICD-10-CM | POA: Diagnosis not present

## 2018-01-20 DIAGNOSIS — K9589 Other complications of other bariatric procedure: Secondary | ICD-10-CM | POA: Diagnosis present

## 2018-01-20 DIAGNOSIS — E039 Hypothyroidism, unspecified: Secondary | ICD-10-CM | POA: Diagnosis present

## 2018-01-20 DIAGNOSIS — R112 Nausea with vomiting, unspecified: Secondary | ICD-10-CM | POA: Diagnosis present

## 2018-01-20 DIAGNOSIS — E669 Obesity, unspecified: Secondary | ICD-10-CM | POA: Diagnosis present

## 2018-01-20 DIAGNOSIS — Z9851 Tubal ligation status: Secondary | ICD-10-CM | POA: Diagnosis not present

## 2018-01-20 DIAGNOSIS — Z79890 Hormone replacement therapy (postmenopausal): Secondary | ICD-10-CM | POA: Diagnosis not present

## 2018-01-20 DIAGNOSIS — Y838 Other surgical procedures as the cause of abnormal reaction of the patient, or of later complication, without mention of misadventure at the time of the procedure: Secondary | ICD-10-CM | POA: Diagnosis present

## 2018-01-20 DIAGNOSIS — K5900 Constipation, unspecified: Secondary | ICD-10-CM | POA: Diagnosis not present

## 2018-01-20 DIAGNOSIS — K0889 Other specified disorders of teeth and supporting structures: Secondary | ICD-10-CM | POA: Diagnosis present

## 2018-01-20 DIAGNOSIS — Z91018 Allergy to other foods: Secondary | ICD-10-CM

## 2018-01-20 LAB — COMPREHENSIVE METABOLIC PANEL
ALBUMIN: 4.4 g/dL (ref 3.5–5.0)
ALK PHOS: 66 U/L (ref 38–126)
ALT: 11 U/L — ABNORMAL LOW (ref 14–54)
AST: 22 U/L (ref 15–41)
Anion gap: 12 (ref 5–15)
BILIRUBIN TOTAL: 1.2 mg/dL (ref 0.3–1.2)
BUN: 28 mg/dL — AB (ref 6–20)
CO2: 26 mmol/L (ref 22–32)
Calcium: 9.4 mg/dL (ref 8.9–10.3)
Chloride: 98 mmol/L — ABNORMAL LOW (ref 101–111)
Creatinine, Ser: 1.46 mg/dL — ABNORMAL HIGH (ref 0.44–1.00)
GFR calc Af Amer: 41 mL/min — ABNORMAL LOW (ref >60)
GFR calc non Af Amer: 35 mL/min — ABNORMAL LOW (ref >60)
GLUCOSE: 125 mg/dL — AB (ref 65–99)
Potassium: 4.7 mmol/L (ref 3.5–5.1)
Sodium: 136 mmol/L (ref 135–145)
TOTAL PROTEIN: 8.7 g/dL — AB (ref 6.5–8.1)

## 2018-01-20 LAB — CBC
HEMATOCRIT: 47.2 % — AB (ref 35.0–47.0)
Hemoglobin: 15.3 g/dL (ref 12.0–16.0)
MCH: 28.4 pg (ref 26.0–34.0)
MCHC: 32.3 g/dL (ref 32.0–36.0)
MCV: 88 fL (ref 80.0–100.0)
Platelets: 434 10*3/uL (ref 150–440)
RBC: 5.36 MIL/uL — ABNORMAL HIGH (ref 3.80–5.20)
RDW: 13.5 % (ref 11.5–14.5)
WBC: 13.2 10*3/uL — ABNORMAL HIGH (ref 3.6–11.0)

## 2018-01-20 LAB — LIPASE, BLOOD: Lipase: 27 U/L (ref 11–51)

## 2018-01-20 MED ORDER — ONDANSETRON HCL 4 MG PO TABS: 4.0000 mg | ORAL_TABLET | Freq: Four times a day (QID) | ORAL | Status: DC | PRN

## 2018-01-20 MED ORDER — ONDANSETRON HCL 4 MG/2ML IJ SOLN
4.0000 mg | Freq: Four times a day (QID) | INTRAMUSCULAR | Status: DC | PRN
  Administered 2018-01-20 – 2018-01-21 (×3): 4 mg via INTRAVENOUS
  Filled 2018-01-20 (×3): qty 2

## 2018-01-20 MED ORDER — VITAMIN D 1000 UNITS PO TABS
1000.0000 [IU] | ORAL_TABLET | Freq: Every day | ORAL | Status: DC
  Administered 2018-01-21 – 2018-01-24 (×2): 1000 [IU] via ORAL
  Filled 2018-01-20 (×3): qty 1

## 2018-01-20 MED ORDER — LEVOTHYROXINE SODIUM 50 MCG PO TABS
50.0000 ug | ORAL_TABLET | Freq: Every day | ORAL | Status: DC
  Administered 2018-01-21 – 2018-01-24 (×3): 50 ug via ORAL
  Filled 2018-01-20 (×4): qty 1

## 2018-01-20 MED ORDER — SODIUM CHLORIDE 0.9 % IV SOLN
1.0000 g | INTRAVENOUS | Status: DC
  Administered 2018-01-20 – 2018-01-22 (×3): 1 g via INTRAVENOUS
  Filled 2018-01-20 (×4): qty 10

## 2018-01-20 MED ORDER — SODIUM CHLORIDE 0.9 % IV SOLN
INTRAVENOUS | Status: DC
  Administered 2018-01-20 – 2018-01-24 (×7): via INTRAVENOUS

## 2018-01-20 MED ORDER — ACETAMINOPHEN 650 MG RE SUPP: 650.0000 mg | Freq: Four times a day (QID) | RECTAL | Status: DC | PRN

## 2018-01-20 MED ORDER — CALCIUM CARBONATE ANTACID 500 MG PO CHEW
400.0000 mg | CHEWABLE_TABLET | Freq: Two times a day (BID) | ORAL | Status: DC
  Administered 2018-01-21 – 2018-01-24 (×3): 400 mg via ORAL
  Filled 2018-01-20 (×3): qty 2

## 2018-01-20 MED ORDER — ONDANSETRON HCL 4 MG/2ML IJ SOLN
4.0000 mg | Freq: Once | INTRAMUSCULAR | Status: AC
  Administered 2018-01-20: 4 mg via INTRAVENOUS
  Filled 2018-01-20: qty 2

## 2018-01-20 MED ORDER — VITAMIN B-12 1000 MCG PO TABS
2000.0000 ug | ORAL_TABLET | Freq: Every day | ORAL | Status: DC
  Administered 2018-01-21 – 2018-01-24 (×2): 2000 ug via ORAL
  Filled 2018-01-20 (×3): qty 2

## 2018-01-20 MED ORDER — SODIUM CHLORIDE 0.9 % IV SOLN
1000.0000 mL | Freq: Once | INTRAVENOUS | Status: AC
  Administered 2018-01-20: 1000 mL via INTRAVENOUS

## 2018-01-20 MED ORDER — FERROUS SULFATE 325 (65 FE) MG PO TABS
325.0000 mg | ORAL_TABLET | Freq: Three times a day (TID) | ORAL | Status: DC
  Administered 2018-01-21 – 2018-01-24 (×3): 325 mg via ORAL
  Filled 2018-01-20 (×5): qty 1

## 2018-01-20 MED ORDER — ACETAMINOPHEN 325 MG PO TABS: 650.0000 mg | ORAL_TABLET | Freq: Four times a day (QID) | ORAL | Status: DC | PRN

## 2018-01-20 MED ORDER — SENNOSIDES-DOCUSATE SODIUM 8.6-50 MG PO TABS
1.0000 | ORAL_TABLET | Freq: Every evening | ORAL | Status: DC | PRN
Start: 2018-01-20 — End: 2018-01-24

## 2018-01-20 MED ORDER — HEPARIN SODIUM (PORCINE) 5000 UNIT/ML IJ SOLN
5000.0000 [IU] | Freq: Three times a day (TID) | INTRAMUSCULAR | Status: DC
  Administered 2018-01-20 – 2018-01-23 (×10): 5000 [IU] via SUBCUTANEOUS
  Filled 2018-01-20 (×11): qty 1

## 2018-01-20 MED ORDER — CALCIUM CARBONATE 1250 (500 CA) MG PO TABS
1.0000 | ORAL_TABLET | Freq: Two times a day (BID) | ORAL | Status: DC
  Filled 2018-01-20: qty 1

## 2018-01-20 MED ORDER — ASPIRIN EC 81 MG PO TBEC
81.0000 mg | DELAYED_RELEASE_TABLET | Freq: Every day | ORAL | Status: DC
  Administered 2018-01-21 – 2018-01-24 (×2): 81 mg via ORAL
  Filled 2018-01-20 (×3): qty 1

## 2018-01-20 MED ORDER — CYCLOBENZAPRINE HCL 10 MG PO TABS
5.0000 mg | ORAL_TABLET | Freq: Every day | ORAL | Status: DC
  Administered 2018-01-20 – 2018-01-23 (×4): 5 mg via ORAL
  Filled 2018-01-20 (×4): qty 1

## 2018-01-20 NOTE — ED Notes (Signed)
Kennith Centerracey, EDT called to take patient to the floor

## 2018-01-20 NOTE — ED Notes (Signed)
Attempted to call report, left on hold over 5 minutes, will try again

## 2018-01-20 NOTE — Progress Notes (Signed)
   01/20/18 1735  Clinical Encounter Type  Visited With Patient  Visit Type Initial  Referral From Nurse  Consult/Referral To Chaplain  Spiritual Encounters  Spiritual Needs Brochure;Emotional   CH received a OR to educate PT about an AD. CH reviewed AD and will follow up as needed.

## 2018-01-20 NOTE — ED Triage Notes (Signed)
Pt c/o N/V since last Monday evening. States she was seen by her PCP last week and given medication for nausea but is still unable to keep any liquids or food down.

## 2018-01-20 NOTE — ED Notes (Signed)
Pt given ginger ale, family at bedside. 

## 2018-01-20 NOTE — Progress Notes (Signed)
Pharmacy Antibiotic Note  Tracie Gibson is a 72 y.o. female admitted on 01/20/2018 with UTI.  Pharmacy has been consulted for ceftriaxone dosing.  Plan: Ceftriaxone 1 g IV daily  Height: 5\' 3"  (160 cm) Weight: 170 lb (77.1 kg) IBW/kg (Calculated) : 52.4  Temp (24hrs), Avg:98.8 F (37.1 C), Min:98.8 F (37.1 C), Max:98.8 F (37.1 C)  Recent Labs  Lab 01/15/18 0928 01/20/18 1007  WBC  --  13.2*  CREATININE 0.87 1.46*    Estimated Creatinine Clearance: 35.3 mL/min (A) (by C-G formula based on SCr of 1.46 mg/dL (H)).    Allergies  Allergen Reactions  . Other Anaphylaxis    [place] nuts   Antimicrobials this admission: ceftriaxone 3/11 >>   Dose adjustments this admission:  Microbiology results: 3/11 Urine: Sent  Thank you for allowing pharmacy to be a part of this patient's care.  Cindi CarbonMary M Deadra Diggins, PharmD, BCPS Clinical Pharmacist 01/20/2018 2:09 PM

## 2018-01-20 NOTE — ED Provider Notes (Signed)
[facility] Emergency Department Provider Note   ____________________________________________    I have reviewed the triage vital signs and the nursing notes.   HISTORY  Chief Complaint Emesis     HPI Tracie Gibson is a 72 y.o. female who presents with complaints of nausea and vomiting over the last week.  Patient reports she has not been able to tolerate p.o.'s at all.  She denies abdominal pain, intermittent mild cramping.  No diarrhea.  No blood in her vomitus.  She has never had this before.  No fevers or chills.  No recent travel.  Has taken p.o. antiemetics with little improvement.   Past Medical History:  Diagnosis Date  . Osteomyelitis (HCC)    left leg  . Thyroid disease     Patient Active Problem List   Diagnosis Date Noted  . Dehydration 01/20/2018    Past Surgical History:  Procedure Laterality Date  . FRACTURE SURGERY    . TUBAL LIGATION      Prior to Admission medications   Medication Sig Start Date End Date Taking? Authorizing Provider  aspirin EC 81 MG tablet Take 81 mg by mouth daily.   Yes [provider]  calcium carbonate (OS-CAL - DOSED IN MG OF ELEMENTAL CALCIUM) 1250 (500 Ca) MG tablet Take 1 tablet by mouth.   Yes [provider]  cholecalciferol (VITAMIN D) 1000 units tablet Take 1,000 Units by mouth daily.   Yes [provider]  cyanocobalamin 2000 MCG tablet Take 2,000 mcg by mouth daily.   Yes [provider]  cyclobenzaprine (FLEXERIL) 5 MG tablet Take 1 tablet (5 mg total) by mouth at bedtime. 05/13/17  Yes Payton Mccallumonty, [place], MD  ferrous sulfate 325 (65 FE) MG EC tablet Take 325 mg by mouth 3 (three) times daily with meals.   Yes [provider]  Boris LownKrill Oil 300 MG CAPS Take 1 capsule by mouth daily.    Yes [provider]  levothyroxine (SYNTHROID, LEVOTHROID) 50 MCG tablet Take 50 mcg by mouth daily before breakfast.   Yes [provider]    ondansetron (ZOFRAN-ODT) 4 MG disintegrating tablet Take 1 tablet (4 mg total) by mouth every 8 (eight) hours as needed for nausea or vomiting. 01/15/18  Yes Cook, Jayce G, DO  promethazine (PHENERGAN) 12.5 MG suppository Place 1 suppository (12.5 mg total) rectally every 8 (eight) hours as needed for nausea or vomiting. 01/17/18  Yes Payton Mccallumonty, [place], MD  promethazine (PHENERGAN) 12.5 MG tablet 1-2 tabs po q 8 hours prn 01/17/18  Yes Conty, [place], MD  sulfamethoxazole-trimethoprim (BACTRIM DS,SEPTRA DS) 800-160 MG tablet Take 1 tablet by mouth 2 (two) times daily. 01/17/18  Yes Payton Mccallumonty, [place], MD     Allergies Other  Family History  Problem Relation Age of Onset  . Arthritis Mother   . Alzheimer's disease Father   . Breast cancer Neg Hx     Social History Social History   Tobacco Use  . Smoking status: Never Smoker  . Smokeless tobacco: Never Used  Substance Use Topics  . Alcohol use: No  . Drug use: No    Review of Systems  Constitutional: No fever Eyes: No visual changes.  ENT: No sore throat. Cardiovascular: Denies chest pain. Respiratory: Denies shortness of breath. Gastrointestinal: As above Genitourinary: Negative for dysuria. Musculoskeletal: Negative for back pain. Skin: Negative for rash. Neurological: Negative for headaches   ____________________________________________   PHYSICAL EXAM:  VITAL SIGNS: ED Triage Vitals  Enc Vitals Group  BP 01/20/18 0956 119/90     Pulse Rate 01/20/18 0956 88     Resp 01/20/18 0956 20     Temp 01/20/18 0956 98.8 F (37.1 C)     Temp Source 01/20/18 0956 Oral     SpO2 01/20/18 0956 99 %     Weight 01/20/18 0948 77.1 kg (170 lb)     Height 01/20/18 0948 1.6 m (5\' 3" )     Head Circumference --      Peak Flow --      Pain Score 01/20/18 0948 0     Pain Loc --      Pain Edu? --      Excl. in GC? --     Constitutional: Alert and oriented. No acute distress. Pleasant and interactive Eyes: Conjunctivae are normal.    Nose: No congestion/rhinnorhea. Mouth/Throat: Mucous membranes are dry Neck:  Painless ROM Cardiac: Normal rate and rhythm, good peripheral circulation Respiratory: Normal respiratory effort.  No retractions.  Gastrointestinal: Soft and nontender. No distention.   Genitourinary: deferred Musculoskeletal: Warm and well perfused Neurologic:  Normal speech and language. No gross focal neurologic deficits are appreciated.  Skin:  Skin is warm, dry and intact. No rash noted. Psychiatric: Mood and affect are normal. Speech and behavior are normal.  ____________________________________________   LABS (all labs ordered are listed, but only abnormal results are displayed)  Labs Reviewed  COMPREHENSIVE METABOLIC PANEL - Abnormal; Notable for the following components:      Result Value   Chloride 98 (*)    Glucose, Bld 125 (*)    BUN 28 (*)    Creatinine, Ser 1.46 (*)    Total Protein 8.7 (*)    ALT 11 (*)    GFR calc non Af Amer 35 (*)    GFR calc Af Amer 41 (*)    All other components within normal limits  CBC - Abnormal; Notable for the following components:   WBC 13.2 (*)    RBC 5.36 (*)    HCT 47.2 (*)    All other components within normal limits  LIPASE, BLOOD  URINALYSIS, COMPLETE (UACMP) WITH MICROSCOPIC   ____________________________________________  EKG  None ____________________________________________  RADIOLOGY  None ____________________________________________   PROCEDURES  Procedure(s) performed: No  Procedures   Critical Care performed: No ____________________________________________   INITIAL IMPRESSION / ASSESSMENT AND PLAN / ED COURSE  Pertinent labs & imaging results that were available during my care of the patient were reviewed by me and considered in my medical decision making (see chart for details).  Patient presents with nausea vomiting decreased p.o. intake over the last week.  Lab work is overall unremarkable, mild dehydration,  treated with 2 doses of IV antiemetics but continues to be unable to tolerate p.o.'s, will admit to the hospitalist for further evaluation workup    ____________________________________________   FINAL CLINICAL IMPRESSION(S) / ED DIAGNOSES  Final diagnoses:  Intractable vomiting with nausea, unspecified vomiting type  Dehydration        Note:  This document was prepared using Dragon voice recognition software and may include unintentional dictation errors.    Jene Every, MD 01/20/18 1400

## 2018-01-20 NOTE — H&P (Signed)
[facility] [place] [facility]   PATIENT NAME: Tracie Gibson    MR#:  [id number]  DATE OF BIRTH:  17-Jul-1946  DATE OF ADMISSION:  01/20/2018  PRIMARY CARE PHYSICIAN: [facility]   REQUESTING/REFERRING PHYSICIAN:   CHIEF COMPLAINT:   Chief Complaint  Patient presents with  . Emesis    HISTORY OF PRESENT ILLNESS: Tracie Gibson  is a 72 y.o. female with a known history of thyroid disease presented to the emergency room with nausea and vomiting since last 2 days.  Patient has vomitings which were bilious in nature.  No hematemesis, hemoptysis unable to eat and drink any fluids secondary to vomiting.Marland Kitchen. She was recently evaluated in the urgent care and was started on oral Bactrim for urinary tract infection .No fever, chills.  Appears dry and dehydrated.  She was started on IV fluids in the emergency room.  Hospitalist service was consulted.  No recent travel, sick contacts at home. PAST MEDICAL HISTORY:   Past Medical History:  Diagnosis Date  . Osteomyelitis (HCC)    left leg  . Thyroid disease     PAST SURGICAL HISTORY:  Past Surgical History:  Procedure Laterality Date  . FRACTURE SURGERY    . TUBAL LIGATION      SOCIAL HISTORY:  Social History   Tobacco Use  . Smoking status: Never Smoker  . Smokeless tobacco: Never Used  Substance Use Topics  . Alcohol use: No    FAMILY HISTORY:  Family History  Problem Relation Age of Onset  . Arthritis Mother   . Alzheimer's disease Father   . Breast cancer Neg Hx     DRUG ALLERGIES:  Allergies  Allergen Reactions  . Other Anaphylaxis    [place] nuts    REVIEW OF SYSTEMS:   CONSTITUTIONAL: No fever, has fatigue and weakness.  EYES: No blurred or double vision.  EARS, NOSE, AND THROAT: No tinnitus or ear pain.  RESPIRATORY: No cough, shortness of breath, wheezing or hemoptysis.  CARDIOVASCULAR: No chest pain, orthopnea, edema.  GASTROINTESTINAL: Has nausea, vomiting,  No  diarrhea or abdominal pain.  GENITOURINARY: No dysuria, hematuria.  ENDOCRINE: No polyuria, nocturia,  HEMATOLOGY: No anemia, easy bruising or bleeding SKIN: No rash or lesion. MUSCULOSKELETAL: No joint pain or arthritis.   NEUROLOGIC: No tingling, numbness, weakness.  PSYCHIATRY: No anxiety or depression.   MEDICATIONS AT HOME:  Prior to Admission medications   Medication Sig Start Date End Date Taking? Authorizing Provider  aspirin EC 81 MG tablet Take 81 mg by mouth daily.   Yes [provider]  calcium carbonate (OS-CAL - DOSED IN MG OF ELEMENTAL CALCIUM) 1250 (500 Ca) MG tablet Take 1 tablet by mouth.   Yes [provider]  cholecalciferol (VITAMIN D) 1000 units tablet Take 1,000 Units by mouth daily.   Yes [provider]  cyanocobalamin 2000 MCG tablet Take 2,000 mcg by mouth daily.   Yes [provider]  cyclobenzaprine (FLEXERIL) 5 MG tablet Take 1 tablet (5 mg total) by mouth at bedtime. 05/13/17  Yes Payton Mccallumonty, [place], MD  ferrous sulfate 325 (65 FE) MG EC tablet Take 325 mg by mouth 3 (three) times daily with meals.   Yes [provider]  Boris LownKrill Oil 300 MG CAPS Take 1 capsule by mouth daily.    Yes [provider]  levothyroxine (SYNTHROID, LEVOTHROID) 50 MCG tablet Take 50 mcg by mouth daily before breakfast.   Yes [provider]  ondansetron (ZOFRAN-ODT) 4 MG disintegrating  tablet Take 1 tablet (4 mg total) by mouth every 8 (eight) hours as needed for nausea or vomiting. 01/15/18  Yes Cook, Jayce G, DO  promethazine (PHENERGAN) 12.5 MG suppository Place 1 suppository (12.5 mg total) rectally every 8 (eight) hours as needed for nausea or vomiting. 01/17/18  Yes Payton Mccallum, MD  promethazine (PHENERGAN) 12.5 MG tablet 1-2 tabs po q 8 hours prn 01/17/18  Yes Conty, [place], MD  sulfamethoxazole-trimethoprim (BACTRIM DS,SEPTRA DS) 800-160 MG tablet Take 1 tablet by mouth 2 (two) times daily. 01/17/18  Yes Payton Mccallum, MD       PHYSICAL EXAMINATION:   VITAL SIGNS: Blood pressure 123/75, pulse 70, temperature 98.8 F (37.1 C), temperature source Oral, resp. rate 20, height 5\' 3"  (1.6 m), weight 77.1 kg (170 lb), SpO2 98 %.  GENERAL:  72 y.o.-year-old patient lying in the bed with no acute distress.  EYES: Pupils equal, round, reactive to light and accommodation. No scleral icterus. Extraocular muscles intact.  HEENT: Head atraumatic, normocephalic. Oropharynx dry and nasopharynx clear.  NECK:  Supple, no jugular venous distention. No thyroid enlargement, no tenderness.  LUNGS: Normal breath sounds bilaterally, no wheezing, rales,rhonchi or crepitation. No use of accessory muscles of respiration.  CARDIOVASCULAR: S1, S2 normal. No murmurs, rubs, or gallops.  ABDOMEN: Soft, nontender, nondistended. Bowel sounds present. No organomegaly or mass.  EXTREMITIES: No pedal edema, cyanosis, or clubbing.  NEUROLOGIC: Cranial nerves II through XII are intact. Muscle strength 5/5 in all extremities. Sensation intact. Gait not checked.  PSYCHIATRIC: The patient is alert and oriented x 3.  SKIN: No obvious rash, lesion, or ulcer.   LABORATORY PANEL:   CBC Recent Labs  Lab 01/20/18 1007  WBC 13.2*  HGB 15.3  HCT 47.2*  PLT 434  MCV 88.0  MCH 28.4  MCHC 32.3  RDW 13.5   ------------------------------------------------------------------------------------------------------------------  Chemistries  Recent Labs  Lab 01/15/18 0928 01/20/18 1007  NA 138 136  K 4.2 4.7  CL 100* 98*  CO2 28 26  GLUCOSE 89 125*  BUN 11 28*  CREATININE 0.87 1.46*  CALCIUM 9.4 9.4  AST 24 22  ALT 14 11*  ALKPHOS 70 66  BILITOT 0.7 1.2   ------------------------------------------------------------------------------------------------------------------ estimated creatinine clearance is 35.3 mL/min (A) (by C-G formula based on SCr of 1.46 mg/dL  (H)). ------------------------------------------------------------------------------------------------------------------ No results for input(s): TSH, T4TOTAL, T3FREE, THYROIDAB in the last 72 hours.  Invalid input(s): FREET3   Coagulation profile No results for input(s): INR, PROTIME in the last 168 hours. ------------------------------------------------------------------------------------------------------------------- No results for input(s): DDIMER in the last 72 hours. -------------------------------------------------------------------------------------------------------------------  Cardiac Enzymes No results for input(s): CKMB, TROPONINI, MYOGLOBIN in the last 168 hours.  Invalid input(s): CK ------------------------------------------------------------------------------------------------------------------ Invalid input(s): POCBNP  ---------------------------------------------------------------------------------------------------------------  Urinalysis    Component Value Date/Time   COLORURINE YELLOW 01/17/2018 0930   APPEARANCEUR CLOUDY (A) 01/17/2018 0930   LABSPEC 1.020 01/17/2018 0930   PHURINE 5.5 01/17/2018 0930   GLUCOSEU NEGATIVE 01/17/2018 0930   HGBUR TRACE (A) 01/17/2018 0930   BILIRUBINUR SMALL (A) 01/17/2018 0930   KETONESUR 40 (A) 01/17/2018 0930   PROTEINUR NEGATIVE 01/17/2018 0930   NITRITE POSITIVE (A) 01/17/2018 0930   LEUKOCYTESUR MODERATE (A) 01/17/2018 0930     RADIOLOGY: No results found.  EKG: Orders placed or performed during the hospital encounter of 05/13/17  . EKG 12-Lead  . EKG 12-Lead  . ED EKG  . ED EKG    IMPRESSION AND PLAN:  72 year old female patient with history of thyroid disease presented to  the emergency room with nausea and vomiting.  Was being treated for urinary tract infection as outpatient with Bactrim.  1.  Intractable nausea vomiting Antiemetics  2. Dehydration IV fluids  3 acute renal failure. IV fluid  hydration with  4.  Urinary tract infection Hold Bactrim as patient is not able to tolerate oral meds IV Rocephin antibiotic  All the records are reviewed and case discussed with ED provider. Management plans discussed with the patient, family and they are in agreement.  CODE STATUS:FULL CODE Code Status History    This patient does not have a recorded code status. Please follow your organizational policy for patients in this situation.       TOTAL TIME TAKING CARE OF THIS PATIENT: 50 minutes.    Ihor Austin M.D on 01/20/2018 at 1:48 PM  Between 7am to 6pm - Pager - [phone]  After 6pm go to [email or web address] - password [email or web address] [facility]  [place] [facility]  Office  [phone]  CC: Primary care physician; Raynelle Bring

## 2018-01-21 ENCOUNTER — Inpatient Hospital Stay: Payer: Medicare Other

## 2018-01-21 LAB — BASIC METABOLIC PANEL
Anion gap: 6 (ref 5–15)
BUN: 19 mg/dL (ref 6–20)
CALCIUM: 8.4 mg/dL — AB (ref 8.9–10.3)
CO2: 25 mmol/L (ref 22–32)
CREATININE: 1.04 mg/dL — AB (ref 0.44–1.00)
Chloride: 107 mmol/L (ref 101–111)
GFR calc Af Amer: >60 mL/min (ref >60)
GFR calc non Af Amer: 53 mL/min — ABNORMAL LOW (ref >60)
GLUCOSE: 107 mg/dL — AB (ref 65–99)
Potassium: 4.3 mmol/L (ref 3.5–5.1)
Sodium: 138 mmol/L (ref 135–145)

## 2018-01-21 LAB — CBC
HCT: 37.8 % (ref 35.0–47.0)
HEMOGLOBIN: 12.4 g/dL (ref 12.0–16.0)
MCH: 29 pg (ref 26.0–34.0)
MCHC: 32.7 g/dL (ref 32.0–36.0)
MCV: 88.8 fL (ref 80.0–100.0)
PLATELETS: 290 10*3/uL (ref 150–440)
RBC: 4.26 MIL/uL (ref 3.80–5.20)
RDW: 13.3 % (ref 11.5–14.5)
WBC: 8.1 10*3/uL (ref 3.6–11.0)

## 2018-01-21 LAB — URINALYSIS, COMPLETE (UACMP) WITH MICROSCOPIC
Bacteria, UA: NONE SEEN
Bilirubin Urine: NEGATIVE
GLUCOSE, UA: NEGATIVE mg/dL
Hgb urine dipstick: NEGATIVE
KETONES UR: NEGATIVE mg/dL
Nitrite: NEGATIVE
PROTEIN: NEGATIVE mg/dL
Specific Gravity, Urine: 1.003 — ABNORMAL LOW (ref 1.005–1.030)
pH: 6 (ref 5.0–8.0)

## 2018-01-21 LAB — TSH: TSH: 3.071 u[IU]/mL (ref 0.350–4.500)

## 2018-01-21 MED ORDER — IOPAMIDOL (ISOVUE-300) INJECTION 61%
15.0000 mL | INTRAVENOUS | Status: AC
  Administered 2018-01-21 (×2): 15 mL via ORAL

## 2018-01-21 MED ORDER — PANTOPRAZOLE SODIUM 40 MG IV SOLR
40.0000 mg | Freq: Two times a day (BID) | INTRAVENOUS | Status: DC
  Administered 2018-01-21 – 2018-01-24 (×7): 40 mg via INTRAVENOUS
  Filled 2018-01-21 (×7): qty 40

## 2018-01-21 MED ORDER — IOPAMIDOL (ISOVUE-300) INJECTION 61%
100.0000 mL | Freq: Once | INTRAVENOUS | Status: AC | PRN
  Administered 2018-01-21: 17:00:00 100 mL via INTRAVENOUS

## 2018-01-21 MED ORDER — PANTOPRAZOLE SODIUM 40 MG PO TBEC: 40.0000 mg | DELAYED_RELEASE_TABLET | Freq: Two times a day (BID) | ORAL | Status: DC

## 2018-01-21 MED ORDER — POLYETHYLENE GLYCOL 3350 17 G PO PACK
17.0000 g | PACK | Freq: Two times a day (BID) | ORAL | Status: AC
  Administered 2018-01-21 – 2018-01-22 (×2): 17 g via ORAL
  Filled 2018-01-21 (×3): qty 1

## 2018-01-21 MED ORDER — PROMETHAZINE HCL 25 MG/ML IJ SOLN
12.5000 mg | Freq: Once | INTRAMUSCULAR | Status: AC
  Administered 2018-01-21: 13:00:00 12.5 mg via INTRAVENOUS
  Filled 2018-01-21: qty 1

## 2018-01-21 MED ORDER — METOCLOPRAMIDE HCL 5 MG/ML IJ SOLN
5.0000 mg | Freq: Four times a day (QID) | INTRAMUSCULAR | Status: AC
  Administered 2018-01-21 – 2018-01-22 (×4): 5 mg via INTRAVENOUS
  Filled 2018-01-21 (×4): qty 2

## 2018-01-21 MED ORDER — IOPAMIDOL (ISOVUE-300) INJECTION 61%: 15.0000 mL | INTRAVENOUS | Status: DC

## 2018-01-21 NOTE — Plan of Care (Signed)
  Progressing Education: Knowledge of General Education information will improve 01/21/2018 1810 - Progressing by Kathreen CosierMalcolm, Loyce Flaming A, RN Health Behavior/Discharge Planning: Ability to manage health-related needs will improve 01/21/2018 1810 - Progressing by Kathreen CosierMalcolm, Jacqualine Weichel A, RN

## 2018-01-21 NOTE — Progress Notes (Signed)
[facility] [facility] - [place] at [facility]   PATIENT NAME: Tracie Gibson    MR#:  [id number]  DATE OF BIRTH:  [birth year]  SUBJECTIVE:  CHIEF COMPLAINT:   Chief Complaint  Patient presents with  . Emesis   Still has vomiting. Mild abd cramping NO BM in 8 days  REVIEW OF SYSTEMS:    Review of Systems  Constitutional: Positive for malaise/fatigue. Negative for chills and fever.  HENT: Negative for sore throat.   Eyes: Negative for blurred vision, double vision and pain.  Respiratory: Negative for cough, hemoptysis, shortness of breath and wheezing.   Cardiovascular: Negative for chest pain, palpitations, orthopnea and leg swelling.  Gastrointestinal: Positive for abdominal pain, constipation, nausea and vomiting. Negative for diarrhea and heartburn.  Genitourinary: Negative for dysuria and hematuria.  Musculoskeletal: Negative for back pain and joint pain.  Skin: Negative for rash.  Neurological: Positive for weakness. Negative for sensory change, speech change, focal weakness and headaches.  Endo/Heme/Allergies: Does not bruise/bleed easily.  Psychiatric/Behavioral: Negative for depression. The patient is not nervous/anxious.     DRUG ALLERGIES:   Allergies  Allergen Reactions  . Other Anaphylaxis    [place] nuts    VITALS:  Blood pressure (!) 111/59, pulse 66, temperature 98.5 F (36.9 C), temperature source Oral, resp. rate 18, height 5\' 3"  (1.6 m), weight 77.1 kg (170 lb), SpO2 97 %.  PHYSICAL EXAMINATION:   Physical Exam  GENERAL:  72 y.o.-year-old patient lying in the bed with no acute distress.  EYES: Pupils equal, round, reactive to light and accommodation. No scleral icterus. Extraocular muscles intact.  HEENT: Head atraumatic, normocephalic. Oropharynx and nasopharynx clear.  NECK:  Supple, no jugular venous distention. No thyroid enlargement, no tenderness.  LUNGS: Normal breath sounds bilaterally, no wheezing, rales, rhonchi. No use of accessory  muscles of respiration.  CARDIOVASCULAR: S1, S2 normal. No murmurs, rubs, or gallops. ABDOMEN: Soft, nontender, nondistended. Bowel sounds present. No organomegaly or mass.  EXTREMITIES: No cyanosis, clubbing or edema b/l.    NEUROLOGIC: Cranial nerves II through XII are intact. No focal Motor or sensory deficits b/l.   PSYCHIATRIC: The patient is alert and oriented x 3.  SKIN: No obvious rash, lesion, or ulcer.   LABORATORY PANEL:   CBC Recent Labs  Lab 01/21/18 0611  WBC 8.1  HGB 12.4  HCT 37.8  PLT 290   ------------------------------------------------------------------------------------------------------------------ Chemistries  Recent Labs  Lab 01/20/18 1007 01/21/18 0611  NA 136 138  K 4.7 4.3  CL 98* 107  CO2 26 25  GLUCOSE 125* 107*  BUN 28* 19  CREATININE 1.46* 1.04*  CALCIUM 9.4 8.4*  AST 22  --   ALT 11*  --   ALKPHOS 66  --   BILITOT 1.2  --    ------------------------------------------------------------------------------------------------------------------  Cardiac Enzymes No results for input(s): TROPONINI in the last 168 hours. ------------------------------------------------------------------------------------------------------------------  RADIOLOGY:  No results found.   ASSESSMENT AND PLAN:   72 year old female patient with history of thyroid disease presented to the emergency room with nausea and vomiting.  Was being treated for urinary tract infection as outpatient with Bactrim.  * Vomiting, nausea Still has vomiting today. Check CT abdomen and pelvis Check TSH  * Dehydration due to poor PO intake Continue IVF  * AKI due to dehydration Improving IVF  * UTI On Iv ceftriaxone    All the records are reviewed and case discussed with Care Management/Social Workerr. Management plans discussed with the patient, family and they are in agreement.  CODE STATUS: FULL CODE  DVT Prophylaxis: SCDs  TOTAL TIME TAKING CARE OF THIS  PATIENT: 30 minutes.   POSSIBLE D/C IN 1-2 DAYS, DEPENDING ON CLINICAL CONDITION.  Orie Fisherman M.D on 01/21/2018 at 12:42 PM  Between 7am to 6pm - Pager - [phone]  After 6pm go to [email or web address] - password [email or web address]  [facility] [place] [facility]  Office  [phone]  CC: Primary care physician; Raynelle Bring  Note: This dictation was prepared with Dragon dictation along with smaller phrase technology. Any transcriptional errors that result from this process are unintentional.

## 2018-01-21 NOTE — Care Management Note (Signed)
Case Management Note  Patient Details  Name: Filbert Schilderlizabeth P Lipari MRN: [id number] Date of Birth: 1945-12-07  Subjective/Objective:    Admitted to [facility] with the diagnosis of dehydration. Lives alone. States her mother will be moving in the home this coming Saturday. States she see Dr. Lorre [facility]Grandis at [facility]. Prescriptions are filled at [facility] in [place]. Takes care of all basic activities of daily living herself, drives. No medical equipment in the home. No falls, decreased appetite since she has been sick.                Action/Plan: No discharge needs identified at this time.   Expected Discharge Date:                  Expected Discharge Plan:     In-House Referral:   yes  Discharge planning Services   maybe  Post Acute Care Choice:    Choice offered to:     DME Arranged:    DME Agency:     HH Arranged:    HH Agency:     Status of Service:     If discussed at [employer] Meetings, dates discussed:    Additional Comments:  Gwenette GreetBrenda S Rachit Grim, RN MSN CCM Care Management [phone] 01/21/2018, 3:13 PM

## 2018-01-22 DIAGNOSIS — E860 Dehydration: Secondary | ICD-10-CM

## 2018-01-22 DIAGNOSIS — R112 Nausea with vomiting, unspecified: Secondary | ICD-10-CM

## 2018-01-22 LAB — URINE CULTURE
Culture: NO GROWTH
Special Requests: NORMAL

## 2018-01-22 NOTE — Progress Notes (Signed)
Patient alert and oriented, vss, no complaints of pain.  No nausea during dayshift.  Patient has signed consent for EGD tomorrow and given information regarding procedure.  She has no questions at this time.  Will be NPO after midnight.

## 2018-01-22 NOTE — Consult Note (Signed)
Midge Minium, MD [facility]  [address], [address] [place], [place] [address] Phone: [phone] Fax : [phone]  Consultation  Referring Provider:     Dr. Elpidio Anis Primary Care Physician:  Raynelle Bring Primary Gastroenterologist:  Dr. Mechele Collin         Reason for Consultation:     Nausea and vomiting  Date of Admission:  01/20/2018 Date of Consultation:  01/22/2018         HPI:   Tracie Gibson is a 72 y.o. female who comes in on March 11 with a report of nausea and vomiting for 2 days prior to admission.  The patient had recently been treated for a urinary tract infection.  Despite being treated with IV fluids and for her urinary tract infection the patient continues to have nausea.  She is status post gastric bypass surgery by Dr. Alva Garnet.  She also reports that she thinks she had an EGD and colonoscopy back approximately 7 years ago.  The patient also reports that she has abdominal pain that is worse when she starts to retch.  She had a CT scan yesterday that showed her to have changes consistent with gastric bypass surgery and also showing retained food in the gastric pouch.  The patient had eaten a few bites of a bagel this morning but has not been able to eat much more than that.  I am now being asked to see the patient for nausea and vomiting.  Past Medical History:  Diagnosis Date  . Osteomyelitis (HCC)    left leg  . Thyroid disease     Past Surgical History:  Procedure Laterality Date  . FRACTURE SURGERY    . TUBAL LIGATION      Prior to Admission medications   Medication Sig Start Date End Date Taking? Authorizing Provider  aspirin EC 81 MG tablet Take 81 mg by mouth daily.   Yes [provider]  calcium carbonate (OS-CAL - DOSED IN MG OF ELEMENTAL CALCIUM) 1250 (500 Ca) MG tablet Take 1 tablet by mouth.   Yes [provider]  cholecalciferol (VITAMIN D) 1000 units tablet Take 1,000 Units by mouth daily.   Yes [provider]    cyanocobalamin 2000 MCG tablet Take 2,000 mcg by mouth daily.   Yes [provider]  cyclobenzaprine (FLEXERIL) 5 MG tablet Take 1 tablet (5 mg total) by mouth at bedtime. 05/13/17  Yes Payton Mccallum, MD  ferrous sulfate 325 (65 FE) MG EC tablet Take 325 mg by mouth 3 (three) times daily with meals.   Yes [provider]  Boris Lown Oil 300 MG CAPS Take 1 capsule by mouth daily.    Yes [provider]  levothyroxine (SYNTHROID, LEVOTHROID) 50 MCG tablet Take 50 mcg by mouth daily before breakfast.   Yes [provider]  ondansetron (ZOFRAN-ODT) 4 MG disintegrating tablet Take 1 tablet (4 mg total) by mouth every 8 (eight) hours as needed for nausea or vomiting. 01/15/18  Yes Cook, Jayce G, DO  promethazine (PHENERGAN) 12.5 MG suppository Place 1 suppository (12.5 mg total) rectally every 8 (eight) hours as needed for nausea or vomiting. 01/17/18  Yes Payton Mccallum, MD  promethazine (PHENERGAN) 12.5 MG tablet 1-2 tabs po q 8 hours prn 01/17/18  Yes Conty, Orlando, MD  sulfamethoxazole-trimethoprim (BACTRIM DS,SEPTRA DS) 800-160 MG tablet Take 1 tablet by mouth 2 (two) times daily. 01/17/18  Yes Payton Mccallum, MD    Family History  Problem Relation Age of Onset  .  Arthritis Mother   . Alzheimer's disease Father   . Breast cancer Neg Hx      Social History   Tobacco Use  . Smoking status: Never Smoker  . Smokeless tobacco: Never Used  Substance Use Topics  . Alcohol use: No  . Drug use: No    Allergies as of 01/20/2018 - Review Complete 01/20/2018  Allergen Reaction Noted  . Other Anaphylaxis 01/17/2018    Review of Systems:    All systems reviewed and negative except where noted in HPI.   Physical Exam:  Vital signs in last 24 hours: Temp:  [97.8 F (36.6 C)-98.9 F (37.2 C)] 97.8 F (36.6 C) (03/13 0429) Pulse Rate:  [59-67] 59 (03/13 0429) Resp:  [16-17] 16 (03/13 0429) BP: (101-115)/(67-69) 101/69 (03/13 0429) SpO2:  [99 %-100 %] 99 % (03/13  0429) Last BM Date: 01/13/18 General:   Pleasant, cooperative in NAD Head:  Normocephalic and atraumatic. Eyes:   No icterus.   Conjunctiva pink. PERRLA. Ears:  Normal auditory acuity. Neck:  Supple; no masses or thyroidomegaly Lungs: Respirations even and unlabored. Lungs clear to auscultation bilaterally.   No wheezes, crackles, or rhonchi.  Heart:  Regular rate and rhythm;  Without murmur, clicks, rubs or gallops Abdomen:  Soft, nondistended, nontender. Normal bowel sounds. No appreciable masses or hepatomegaly.  No rebound or guarding.  Rectal:  Not performed. Msk:  Symmetrical without gross deformities.    Extremities:  Without edema, cyanosis or clubbing. Neurologic:  Alert and oriented x3;  grossly normal neurologically. Skin:  Intact without significant lesions or rashes. Cervical Nodes:  No significant cervical adenopathy. Psych:  Alert and cooperative. Normal affect.  LAB RESULTS: Recent Labs    01/20/18 1007 01/21/18 0611  WBC 13.2* 8.1  HGB 15.3 12.4  HCT 47.2* 37.8  PLT 434 290   BMET Recent Labs    01/20/18 1007 01/21/18 0611  NA 136 138  K 4.7 4.3  CL 98* 107  CO2 26 25  GLUCOSE 125* 107*  BUN 28* 19  CREATININE 1.46* 1.04*  CALCIUM 9.4 8.4*   LFT Recent Labs    01/20/18 1007  PROT 8.7*  ALBUMIN 4.4  AST 22  ALT 11*  ALKPHOS 66  BILITOT 1.2   PT/INR No results for input(s): LABPROT, INR in the last 72 hours.  STUDIES: Ct Abdomen Pelvis W Contrast  Result Date: 01/21/2018 CLINICAL DATA:  Nausea and vomiting and abdominal pain EXAM: CT ABDOMEN AND PELVIS WITH CONTRAST TECHNIQUE: Multidetector CT imaging of the abdomen and pelvis was performed using the standard protocol following bolus administration of intravenous contrast. CONTRAST:  100mL ISOVUE-300 IOPAMIDOL (ISOVUE-300) INJECTION 61% COMPARISON:  01/17/18 FINDINGS: Lower chest: No acute abnormality. Hepatobiliary: Liver is within normal limits. The gallbladder is well distended although no  gallstones or pericholecystic fluid is noted. Pancreas: Unremarkable. No pancreatic ductal dilatation or surrounding inflammatory changes. Spleen: Normal in size without focal abnormality. Adrenals/Urinary Tract: Adrenal glands are within normal limits. Bilateral extrarenal pelves are seen. No renal calculi or obstructive changes are noted. Bladder is partially distended. Stomach/Bowel: Stomach shows postsurgical changes consistent with gastric bypass. Considerable ingested food stuffs are noted within the gastric lumen. No obstructive or inflammatory changes are noted. Appendix appears normal. No evidence of bowel wall thickening, distention, or inflammatory changes. Vascular/Lymphatic: Aortic atherosclerosis. No enlarged abdominal or pelvic lymph nodes. Reproductive: Uterus and bilateral adnexa are unremarkable. Other: No abdominal wall hernia or abnormality. No abdominopelvic ascites. Musculoskeletal: Degenerative changes of the lumbar spine are  noted. IMPRESSION: Chronic changes without acute abnormality. Electronically Signed   By: Alcide Clever M.D.   On: 01/21/2018 18:29      Impression / Plan:   Tracie Gibson is a 72 y.o. y/o female with nausea and vomiting that was first attributed to the patient's urinary tract infection.  The patient has been treated for the UTI and also been rehydrated but continues to have nausea vomiting.  The patient CT scan did show a gastric pouch with retained food.  The patient will be set up for an EGD for tomorrow. I have discussed risks & benefits which include, but are not limited to, bleeding, infection, perforation & drug reaction.  The patient agrees with this plan & written consent will be obtained.     Thank you for involving me in the care of this patient.      LOS: 2 days   Midge Minium, MD  01/22/2018, 2:20 PM   Note: This dictation was prepared with Dragon dictation along with smaller phrase technology. Any transcriptional errors that result from  this process are unintentional.

## 2018-01-22 NOTE — Progress Notes (Signed)
[facility] - [place] at [facility]   PATIENT NAME: Tracie Gibson    MR#:  [id number]  DATE OF BIRTH:  [birth year]  SUBJECTIVE:  CHIEF COMPLAINT:   Chief Complaint  Patient presents with  . Emesis   Continues to have vomiting.  Patient is scared to eat. Afebrile.  REVIEW OF SYSTEMS:    Review of Systems  Constitutional: Positive for malaise/fatigue. Negative for chills and fever.  HENT: Negative for sore throat.   Eyes: Negative for blurred vision, double vision and pain.  Respiratory: Negative for cough, hemoptysis, shortness of breath and wheezing.   Cardiovascular: Negative for chest pain, palpitations, orthopnea and leg swelling.  Gastrointestinal: Positive for abdominal pain, constipation, nausea and vomiting. Negative for diarrhea and heartburn.  Genitourinary: Negative for dysuria and hematuria.  Musculoskeletal: Negative for back pain and joint pain.  Skin: Negative for rash.  Neurological: Positive for weakness. Negative for sensory change, speech change, focal weakness and headaches.  Endo/Heme/Allergies: Does not bruise/bleed easily.  Psychiatric/Behavioral: Negative for depression. The patient is not nervous/anxious.     DRUG ALLERGIES:   Allergies  Allergen Reactions  . Other Anaphylaxis    [place] nuts    VITALS:  Blood pressure 101/69, pulse (!) 59, temperature 97.8 F (36.6 C), temperature source Oral, resp. rate 16, height 5\' 3"  (1.6 m), weight 77.1 kg (170 lb), SpO2 99 %.  PHYSICAL EXAMINATION:   Physical Exam  GENERAL:  72 y.o.-year-old patient lying in the bed with no acute distress.  EYES: Pupils equal, round, reactive to light and accommodation. No scleral icterus. Extraocular muscles intact.  HEENT: Head atraumatic, normocephalic. Oropharynx and nasopharynx clear.  NECK:  Supple, no jugular venous distention. No thyroid enlargement, no tenderness.  LUNGS: Normal breath sounds bilaterally, no wheezing, rales, rhonchi. No use of  accessory muscles of respiration.  CARDIOVASCULAR: S1, S2 normal. No murmurs, rubs, or gallops. ABDOMEN: Soft, nontender, nondistended. Bowel sounds present. No organomegaly or mass.  EXTREMITIES: No cyanosis, clubbing or edema b/l.    NEUROLOGIC: Cranial nerves II through XII are intact. No focal Motor or sensory deficits b/l.   PSYCHIATRIC: The patient is alert and oriented x 3.  SKIN: No obvious rash, lesion, or ulcer.   LABORATORY PANEL:   CBC Recent Labs  Lab 01/21/18 0611  WBC 8.1  HGB 12.4  HCT 37.8  PLT 290   ------------------------------------------------------------------------------------------------------------------ Chemistries  Recent Labs  Lab 01/20/18 1007 01/21/18 0611  NA 136 138  K 4.7 4.3  CL 98* 107  CO2 26 25  GLUCOSE 125* 107*  BUN 28* 19  CREATININE 1.46* 1.04*  CALCIUM 9.4 8.4*  AST 22  --   ALT 11*  --   ALKPHOS 66  --   BILITOT 1.2  --    ------------------------------------------------------------------------------------------------------------------  Cardiac Enzymes No results for input(s): TROPONINI in the last 168 hours. ------------------------------------------------------------------------------------------------------------------  RADIOLOGY:  Ct Abdomen Pelvis W Contrast  Result Date: 01/21/2018 CLINICAL DATA:  Nausea and vomiting and abdominal pain EXAM: CT ABDOMEN AND PELVIS WITH CONTRAST TECHNIQUE: Multidetector CT imaging of the abdomen and pelvis was performed using the standard protocol following bolus administration of intravenous contrast. CONTRAST:  100mL ISOVUE-300 IOPAMIDOL (ISOVUE-300) INJECTION 61% COMPARISON:  01/17/18 FINDINGS: Lower chest: No acute abnormality. Hepatobiliary: Liver is within normal limits. The gallbladder is well distended although no gallstones or pericholecystic fluid is noted. Pancreas: Unremarkable. No pancreatic ductal dilatation or surrounding inflammatory changes. Spleen: Normal in size without  focal abnormality. Adrenals/Urinary Tract: Adrenal glands are  within normal limits. Bilateral extrarenal pelves are seen. No renal calculi or obstructive changes are noted. Bladder is partially distended. Stomach/Bowel: Stomach shows postsurgical changes consistent with gastric bypass. Considerable ingested food stuffs are noted within the gastric lumen. No obstructive or inflammatory changes are noted. Appendix appears normal. No evidence of bowel wall thickening, distention, or inflammatory changes. Vascular/Lymphatic: Aortic atherosclerosis. No enlarged abdominal or pelvic lymph nodes. Reproductive: Uterus and bilateral adnexa are unremarkable. Other: No abdominal wall hernia or abnormality. No abdominopelvic ascites. Musculoskeletal: Degenerative changes of the lumbar spine are noted. IMPRESSION: Chronic changes without acute abnormality. Electronically Signed   By: Alcide Clever M.D.   On: 01/21/2018 18:29     ASSESSMENT AND PLAN:   72 year old female patient with history of thyroid disease presented to the emergency room with nausea and vomiting.  Was being treated for urinary tract infection as outpatient with Bactrim.  * Vomiting, nausea Still has vomiting today. CT scan of the abdomen and pelvis shows food residue significant amount in the stomach.  Concern regarding stricture from her prior gastric bypass surgery.  Discussed with Dr. Daleen Squibb of GI.  Will likely need EGD.  * Dehydration due to poor PO intake Continue IVF  * AKI due to dehydration Improving IVF  * UTI On Iv ceftriaxone Cultures NGTD   All the records are reviewed and case discussed with Care Management/Social Workerr. Management plans discussed with the patient, family and they are in agreement.  CODE STATUS: FULL CODE  DVT Prophylaxis: SCDs  TOTAL TIME TAKING CARE OF THIS PATIENT: 30 minutes.   POSSIBLE D/C IN 1-2 DAYS, DEPENDING ON CLINICAL CONDITION.  Orie Fisherman M.D on 01/22/2018 at 11:49  AM  Between 7am to 6pm - Pager - [phone]  After 6pm go to [email or web address] - password [email or web address]  [facility] [place] [facility]  Office  [phone]  CC: Primary care physician; Raynelle Bring  Note: This dictation was prepared with Dragon dictation along with smaller phrase technology. Any transcriptional errors that result from this process are unintentional.

## 2018-01-23 ENCOUNTER — Inpatient Hospital Stay: Payer: Medicare Other | Admitting: Anesthesiology

## 2018-01-23 ENCOUNTER — Encounter
Admission: EM | Disposition: A | Discharge status: Home / Self Care | Payer: Self-pay | Source: Home / Self Care | Attending: Internal Medicine

## 2018-01-23 ENCOUNTER — Encounter: Payer: Self-pay | Admitting: Emergency Medicine

## 2018-01-23 HISTORY — PX: ESOPHAGOGASTRODUODENOSCOPY (EGD) WITH PROPOFOL: SHX5813

## 2018-01-23 SURGERY — ESOPHAGOGASTRODUODENOSCOPY (EGD) WITH PROPOFOL
Anesthesia: General

## 2018-01-23 MED ORDER — PROPOFOL 500 MG/50ML IV EMUL
INTRAVENOUS | Status: DC | PRN
  Administered 2018-01-23: 100 ug/kg/min via INTRAVENOUS

## 2018-01-23 MED ORDER — SODIUM CHLORIDE 0.9 % IV SOLN
INTRAVENOUS | Status: DC
  Administered 2018-01-23: 1000 mL via INTRAVENOUS

## 2018-01-23 MED ORDER — LIDOCAINE HCL (CARDIAC) 20 MG/ML IV SOLN
INTRAVENOUS | Status: DC | PRN
  Administered 2018-01-23: 30 mg via INTRAVENOUS

## 2018-01-23 MED ORDER — FENTANYL CITRATE (PF) 100 MCG/2ML IJ SOLN
INTRAMUSCULAR | Status: DC | PRN
  Administered 2018-01-23: 50 ug via INTRAVENOUS

## 2018-01-23 MED ORDER — BUTAMBEN-TETRACAINE-BENZOCAINE 2-2-14 % EX AERO
INHALATION_SPRAY | CUTANEOUS | Status: DC | PRN
  Administered 2018-01-23: 2 via TOPICAL

## 2018-01-23 MED ORDER — FENTANYL CITRATE (PF) 100 MCG/2ML IJ SOLN
INTRAMUSCULAR | Status: AC
  Filled 2018-01-23: qty 2

## 2018-01-23 MED ORDER — ONDANSETRON HCL 4 MG/2ML IJ SOLN
INTRAMUSCULAR | Status: DC | PRN
  Administered 2018-01-23: 4 mg via INTRAVENOUS

## 2018-01-23 MED ORDER — EPHEDRINE SULFATE 50 MG/ML IJ SOLN
INTRAMUSCULAR | Status: DC | PRN
  Administered 2018-01-23: 10 mg via INTRAVENOUS

## 2018-01-23 MED ORDER — SUCRALFATE 1 GM/10ML PO SUSP
1.0000 g | Freq: Four times a day (QID) | ORAL | Status: DC
Start: 2018-01-23 — End: 2018-01-24
  Administered 2018-01-23 – 2018-01-24 (×4): 1 g via ORAL
  Filled 2018-01-23 (×4): qty 10

## 2018-01-23 MED ORDER — POLYETHYLENE GLYCOL 3350 17 G PO PACK
17.0000 g | PACK | Freq: Every day | ORAL | Status: DC | PRN
  Administered 2018-01-23: 17 g via ORAL
  Filled 2018-01-23: qty 1

## 2018-01-23 MED ORDER — PROPOFOL 500 MG/50ML IV EMUL
INTRAVENOUS | Status: AC
  Filled 2018-01-23: qty 50

## 2018-01-23 NOTE — Anesthesia Preprocedure Evaluation (Signed)
Anesthesia Evaluation  Patient identified by MRN, date of birth, ID band Patient awake    Reviewed: Allergy & Precautions, NPO status , Patient's Chart, lab work & pertinent test results  History of Anesthesia Complications Negative for: history of anesthetic complications  Airway Mallampati: III  TM Distance: >3 FB Neck ROM: Full    Dental  (+) Missing, Poor Dentition   Pulmonary neg pulmonary ROS, neg sleep apnea, neg COPD,    breath sounds clear to auscultation- rhonchi (-) wheezing      Cardiovascular Exercise Tolerance: Good (-) hypertension(-) CAD, (-) Past MI, (-) Cardiac Stents and (-) CABG  Rhythm:Regular Rate:Normal - Systolic murmurs and - Diastolic murmurs    Neuro/Psych negative neurological ROS  negative psych ROS   GI/Hepatic Neg liver ROS, eval for N/V with eating, no vomiting today since NPO   Endo/Other  neg diabetesHypothyroidism   Renal/GU negative Renal ROS     Musculoskeletal negative musculoskeletal ROS (+)   Abdominal (+) + obese,   Peds  Hematology negative hematology ROS (+)   Anesthesia Other Findings Past Medical History: No date: Osteomyelitis (HCC)     Comment:  left leg No date: Thyroid disease   Reproductive/Obstetrics                             Anesthesia Physical Anesthesia Plan  ASA: II  Anesthesia Plan: General   Post-op Pain Management:    Induction: Intravenous  PONV Risk Score and Plan: 2 and Propofol infusion  Airway Management Planned: Natural Airway  Additional Equipment:   Intra-op Plan:   Post-operative Plan:   Informed Consent: I have reviewed the patients History and Physical, chart, labs and discussed the procedure including the risks, benefits and alternatives for the proposed anesthesia with the patient or authorized representative who has indicated his/her understanding and acceptance.   Dental advisory given  Plan  Discussed with: CRNA and Anesthesiologist  Anesthesia Plan Comments:         Anesthesia Quick Evaluation

## 2018-01-23 NOTE — Anesthesia Postprocedure Evaluation (Signed)
Anesthesia Post Note  Patient: Tracie Gibson  Procedure(s) Performed: ESOPHAGOGASTRODUODENOSCOPY (EGD) WITH PROPOFOL (N/A )  Patient location during evaluation: Endoscopy Anesthesia Type: General Level of consciousness: awake and alert and oriented Pain management: pain level controlled Vital Signs Assessment: post-procedure vital signs reviewed and stable Respiratory status: spontaneous breathing, nonlabored ventilation and respiratory function stable Cardiovascular status: blood pressure returned to baseline and stable Postop Assessment: no signs of nausea or vomiting Anesthetic complications: no     Last Vitals:  Vitals:   01/23/18 1232 01/23/18 1252  BP: (!) 91/56 (!) 94/56  Pulse: 69   Resp: 16 16  Temp: (!) 36.3 C   SpO2: 100%     Last Pain:  Vitals:   01/23/18 1232  TempSrc: Tympanic  PainSc:                  Tracie Gibson

## 2018-01-23 NOTE — Anesthesia Procedure Notes (Signed)
Performed by: Cook-Martin, Lawana Hartzell Pre-anesthesia Checklist: Patient identified, Suction available, Emergency Drugs available, Patient being monitored and Timeout performed Patient Re-evaluated:Patient Re-evaluated prior to induction Oxygen Delivery Method: Nasal cannula Preoxygenation: Pre-oxygenation with 100% oxygen Induction Type: IV induction Airway Equipment and Method: Bite block Placement Confirmation: CO2 detector and positive ETCO2       

## 2018-01-23 NOTE — Transfer of Care (Signed)
Immediate Anesthesia Transfer of Care Note  Patient: Tracie Gibson  Procedure(s) Performed: ESOPHAGOGASTRODUODENOSCOPY (EGD) WITH PROPOFOL (N/A )  Patient Location: PACU  Anesthesia Type:General  Level of Consciousness: awake and sedated  Airway & Oxygen Therapy: Patient Spontanous Breathing and Patient connected to nasal cannula oxygen  Post-op Assessment: Report given to RN and Post -op Vital signs reviewed and stable  Post vital signs: Reviewed and stable  Last Vitals:  Vitals:   01/23/18 0428 01/23/18 0845  BP: (!) 99/59 (!) 94/49  Pulse: (!) 59 60  Resp: 16 18  Temp: 36.6 C 36.7 C  SpO2: 98% 99%    Last Pain:  Vitals:   01/23/18 0845  TempSrc: Oral  PainSc:          Complications: No apparent anesthesia complications

## 2018-01-23 NOTE — Plan of Care (Signed)
  Progressing Education: Knowledge of General Education information will improve 01/23/2018 1513 - Progressing by Luretha MurphyMiles, Nyshaun Standage, RN Health Behavior/Discharge Planning: Ability to manage health-related needs will improve 01/23/2018 1513 - Progressing by Luretha MurphyMiles, Alem Fahl, RN

## 2018-01-23 NOTE — Op Note (Signed)
[facility] Gastroenterology Patient Name: Tracie Gibson Procedure Date: 01/23/2018 12:10 PM MRN: [id number] Account #: [id number] Date of Birth: 11-26-1945 Admit Type: Inpatient Age: 72 Room: [facility] ENDO ROOM 4 Gender: Female Note Status: Finalized Procedure:            Upper GI endoscopy Indications:          Nausea with vomiting Providers:            Midge Miniumarren Issis Lindseth MD, MD Medicines:            Propofol per Anesthesia Complications:        No immediate complications. Procedure:            Pre-Anesthesia Assessment:                       - Prior to the procedure, a History and Physical was                        performed, and patient medications and allergies were                        reviewed. The patient's tolerance of previous                        anesthesia was also reviewed. The risks and benefits of                        the procedure and the sedation options and risks were                        discussed with the patient. All questions were                        answered, and informed consent was obtained. Prior                        Anticoagulants: The patient has taken no previous                        anticoagulant or antiplatelet agents. ASA Grade                        Assessment: II - A patient with mild systemic disease.                        After reviewing the risks and benefits, the patient was                        deemed in satisfactory condition to undergo the                        procedure.                       After obtaining informed consent, the endoscope was                        passed under direct vision. Throughout the procedure,                        the patient's blood pressure,  pulse, and oxygen                        saturations were monitored continuously. The Endoscope                        was introduced through the mouth, and advanced to the                        jejunum. The upper GI endoscopy was accomplished                         without difficulty. The patient tolerated the procedure                        well. Findings:      The examined esophagus was normal.      Evidence of a gastric bypass was found. A gastric pouch with a small       size was found. The staple line appeared intact. The gastrojejunal       anastomosis was characterized by mild stenosis and ulceration. This was       traversed. A TTS dilator was passed through the scope. Dilation with an       18-19-20 mm balloon dilator was performed to 20 mm. The dilation site       was examined following endoscope reinsertion and showed complete       resolution of luminal narrowing.      The examined jejunum was normal. Impression:           - Normal esophagus.                       - Gastric bypass with a small-sized pouch and intact                        staple line. Gastrojejunal anastomosis characterized by                        ulceration and mild stenosis. Dilated.                       - Normal examined jejunum.                       - No specimens collected. Recommendation:       - Use sucralfate suspension 1 gram PO QID. Procedure Code(s):    --- Professional ---                       [phone], Esophagogastroduodenoscopy, flexible, transoral;                        with dilation of gastric/duodenal stricture(s) (eg,                        balloon, bougie) Diagnosis Code(s):    --- Professional ---                       R11.2, Nausea with vomiting, unspecified CPT copyright 2016 American Medical Association. All rights reserved. The codes documented in this report are preliminary and upon coder review may  be revised to meet current  compliance requirements. Midge Minium MD, MD 01/23/2018 12:30:06 PM This report has been signed electronically. Number of Addenda: 0 Note Initiated On: 01/23/2018 12:10 PM      [facility]

## 2018-01-23 NOTE — Progress Notes (Signed)
[facility] - [place] at [facility]   PATIENT NAME: Tracie Gibson    MR#:  [id number]  DATE OF BIRTH:  1946-01-15  SUBJECTIVE:  CHIEF COMPLAINT:   Chief Complaint  Patient presents with  . Emesis   Continues to have vomiting soon after she eats or drinks  Patient is scared to eat. Afebrile. For EGD today  REVIEW OF SYSTEMS:    Review of Systems  Constitutional: Positive for malaise/fatigue. Negative for chills and fever.  HENT: Negative for sore throat.   Eyes: Negative for blurred vision, double vision and pain.  Respiratory: Negative for cough, hemoptysis, shortness of breath and wheezing.   Cardiovascular: Negative for chest pain, palpitations, orthopnea and leg swelling.  Gastrointestinal: Positive for nausea and vomiting. Negative for abdominal pain, constipation, diarrhea and heartburn.  Genitourinary: Negative for dysuria and hematuria.  Musculoskeletal: Negative for back pain and joint pain.  Skin: Negative for rash.  Neurological: Positive for weakness. Negative for sensory change, speech change, focal weakness and headaches.  Endo/Heme/Allergies: Does not bruise/bleed easily.  Psychiatric/Behavioral: Negative for depression. The patient is not nervous/anxious.     DRUG ALLERGIES:   Allergies  Allergen Reactions  . Other Anaphylaxis    [place] nuts    VITALS:  Blood pressure (!) 94/56, pulse 69, temperature (!) 97.3 F (36.3 C), temperature source Tympanic, resp. rate 16, height 5\' 3"  (1.6 m), weight 77.1 kg (170 lb), SpO2 100 %.  PHYSICAL EXAMINATION:   Physical Exam  GENERAL:  72 y.o.-year-old patient lying in the bed with no acute distress.  EYES: Pupils equal, round, reactive to light and accommodation. No scleral icterus. Extraocular muscles intact.  HEENT: Head atraumatic, normocephalic. Oropharynx and nasopharynx clear.  NECK:  Supple, no jugular venous distention. No thyroid enlargement, no tenderness.  LUNGS: Normal breath sounds  bilaterally, no wheezing, rales, rhonchi. No use of accessory muscles of respiration.  CARDIOVASCULAR: S1, S2 normal. No murmurs, rubs, or gallops. ABDOMEN: Soft, nontender, nondistended. Bowel sounds present. No organomegaly or mass.  EXTREMITIES: No cyanosis, clubbing or edema b/l.    NEUROLOGIC: Cranial nerves II through XII are intact. No focal Motor or sensory deficits b/l.   PSYCHIATRIC: The patient is alert and oriented x 3.  SKIN: No obvious rash, lesion, or ulcer.   LABORATORY PANEL:   CBC Recent Labs  Lab 01/21/18 0611  WBC 8.1  HGB 12.4  HCT 37.8  PLT 290   ------------------------------------------------------------------------------------------------------------------ Chemistries  Recent Labs  Lab 01/20/18 1007 01/21/18 0611  NA 136 138  K 4.7 4.3  CL 98* 107  CO2 26 25  GLUCOSE 125* 107*  BUN 28* 19  CREATININE 1.46* 1.04*  CALCIUM 9.4 8.4*  AST 22  --   ALT 11*  --   ALKPHOS 66  --   BILITOT 1.2  --    ------------------------------------------------------------------------------------------------------------------  Cardiac Enzymes No results for input(s): TROPONINI in the last 168 hours. ------------------------------------------------------------------------------------------------------------------  RADIOLOGY:  Ct Abdomen Pelvis W Contrast  Result Date: 01/21/2018 CLINICAL DATA:  Nausea and vomiting and abdominal pain EXAM: CT ABDOMEN AND PELVIS WITH CONTRAST TECHNIQUE: Multidetector CT imaging of the abdomen and pelvis was performed using the standard protocol following bolus administration of intravenous contrast. CONTRAST:  ISOVUE-300 IOPAMIDOL (ISOVUE-300) INJECTION 61% COMPARISON:  01/17/18 FINDINGS: Lower chest: No acute abnormality. Hepatobiliary: Liver is within normal limits. The gallbladder is well distended although no gallstones or pericholecystic fluid is noted. Pancreas: Unremarkable. No pancreatic ductal dilatation or surrounding  inflammatory changes. Spleen: Normal  in size without focal abnormality. Adrenals/Urinary Tract: Adrenal glands are within normal limits. Bilateral extrarenal pelves are seen. No renal calculi or obstructive changes are noted. Bladder is partially distended. Stomach/Bowel: Stomach shows postsurgical changes consistent with gastric bypass. Considerable ingested food stuffs are noted within the gastric lumen. No obstructive or inflammatory changes are noted. Appendix appears normal. No evidence of bowel wall thickening, distention, or inflammatory changes. Vascular/Lymphatic: Aortic atherosclerosis. No enlarged abdominal or pelvic lymph nodes. Reproductive: Uterus and bilateral adnexa are unremarkable. Other: No abdominal wall hernia or abnormality. No abdominopelvic ascites. Musculoskeletal: Degenerative changes of the lumbar spine are noted. IMPRESSION: Chronic changes without acute abnormality. Electronically Signed   By: Alcide CleverMark  Lukens M.D.   On: 01/21/2018 18:29     ASSESSMENT AND PLAN:   72 year old female patient with history of thyroid disease presented to the emergency room with nausea and vomiting.  Was being treated for urinary tract infection as outpatient with Bactrim.  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  Concern regarding stricture from her prior gastric bypass surgery.  F/u with GI EGD. - Normal esophagus.Gastric bypass with a small-sized pouch and intact staple line. Gastrojejunal anastomosis characterized by ulceration and mild stenosis. Dilated. - Normal examined jejunum.  Patient is started on Carafate Advance diet as tolerated and anticipate discharge in a.m. if patient is tolerating diet without any nausea or vomiting   * Dehydration due to poor PO intake Continue IVF  * AKI due to dehydration Improving Creatinine at  1.04 on 01/21/2018 IVF  * UTI Urine cultures negative.  Patient has received Bactrim for 3 days and Rocephin for 1 day discontinue antibiotics     All the records are reviewed and case discussed with Care Management/Social Workerr. Management plans discussed with the patient, family and they are in agreement.  CODE STATUS: FULL CODE  DVT Prophylaxis: SCDs  TOTAL TIME TAKING CARE OF THIS PATIENT: 34 minutes.   POSSIBLE D/C IN 1 DAYS, DEPENDING ON CLINICAL CONDITION.  Ramonita LabAruna Kristen Fromm M.D on 01/23/2018 at 2:25 PM  Between 7am to 6pm - Pager - [phone]  After 6pm go to [email or web address] - password [email or web address]  [facility]  Office  [phone]  CC: Primary care physician; [facility]  Note: This dictation was prepared with Dragon dictation along with smaller phrase technology. Any transcriptional errors that result from this process are unintentional.

## 2018-01-23 NOTE — Anesthesia Post-op Follow-up Note (Signed)
Anesthesia QCDR form completed.        

## 2018-01-24 ENCOUNTER — Encounter: Payer: Self-pay | Admitting: Gastroenterology

## 2018-01-24 LAB — BASIC METABOLIC PANEL
Anion gap: 9 (ref 5–15)
BUN: 10 mg/dL (ref 6–20)
CALCIUM: 8.6 mg/dL — AB (ref 8.9–10.3)
CHLORIDE: 106 mmol/L (ref 101–111)
CO2: 26 mmol/L (ref 22–32)
CREATININE: 0.86 mg/dL (ref 0.44–1.00)
GLUCOSE: 95 mg/dL (ref 65–99)
Potassium: 3.5 mmol/L (ref 3.5–5.1)
Sodium: 141 mmol/L (ref 135–145)

## 2018-01-24 LAB — CBC
HCT: 37.6 % (ref 35.0–47.0)
HEMOGLOBIN: 12.4 g/dL (ref 12.0–16.0)
MCH: 29.7 pg (ref 26.0–34.0)
MCHC: 33 g/dL (ref 32.0–36.0)
MCV: 90.1 fL (ref 80.0–100.0)
Platelets: 308 10*3/uL (ref 150–440)
RBC: 4.18 MIL/uL (ref 3.80–5.20)
RDW: 13.2 % (ref 11.5–14.5)
WBC: 6 10*3/uL (ref 3.6–11.0)

## 2018-01-24 MED ORDER — POLYETHYLENE GLYCOL 3350 17 G PO PACK: 17.0000 g | PACK | Freq: Every day | ORAL | 0 refills | Status: AC | PRN

## 2018-01-24 MED ORDER — ACETAMINOPHEN 325 MG PO TABS: 325.0000 mg | ORAL_TABLET | Freq: Four times a day (QID) | ORAL | Status: AC | PRN

## 2018-01-24 MED ORDER — PANTOPRAZOLE SODIUM 40 MG PO TBEC: 40.0000 mg | DELAYED_RELEASE_TABLET | Freq: Two times a day (BID) | ORAL | 0 refills | Status: DC

## 2018-01-24 MED ORDER — PANTOPRAZOLE SODIUM 40 MG PO TBEC: 40.0000 mg | DELAYED_RELEASE_TABLET | Freq: Two times a day (BID) | ORAL | Status: DC

## 2018-01-24 MED ORDER — SUCRALFATE 1 GM/10ML PO SUSP: 1.0000 g | Freq: Four times a day (QID) | ORAL | 1 refills | Status: AC

## 2018-01-24 NOTE — Care Management Important Message (Signed)
Important Message  Patient Details  Name: Tracie Gibson MRN: [id number] Date of Birth: 11-07-1946   Medicare Important Message Given:  Yes    Gwenette GreetBrenda S Grover Robinson, RN 01/24/2018, 7:06 AM

## 2018-01-24 NOTE — Discharge Summary (Signed)
[facility] - [place] at [facility]   PATIENT NAME: Tracie Gibson    MR#:  [id number]  DATE OF BIRTH:  Oct 14, 1946  DATE OF ADMISSION:  01/20/2018 ADMITTING PHYSICIAN: Ihor Austin, MD  DATE OF DISCHARGE:  01/24/18 PRIMARY CARE PHYSICIAN: [facility], [facility]    ADMISSION DIAGNOSIS:  Dehydration [E86.0] Intractable vomiting with nausea, unspecified vomiting type [R11.2]  DISCHARGE DIAGNOSIS:  Active Problems:   Dehydration   Intractable vomiting with nausea anastomosis gastric ulcers with mild stenosis    SECONDARY DIAGNOSIS:   Past Medical History:  Diagnosis Date  . Osteomyelitis (HCC)    left leg  . Thyroid disease     HOSPITAL COURSE:   72 year old female patient with history of thyroid disease presented to the emergency room with nausea and vomiting.Was being treated for urinary tract infection as outpatient with Bactrim.  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  Concern regarding stricture from her prior gastric bypass surgery.  F/u with GI EGD. - Normal esophagus.Gastric bypass with a small-sized pouch and intact staple line. Gastrojejunal anastomosis characterized by ulceration and mild stenosis. Dilated. - Normal examined jejunum.  Patient is started on Carafate Advanced diet as tolerated  Discharge today   * Dehydration due to poor PO intake  given IVF  * AKI due to dehydration Improving Creatinine at 1.04 on 01/21/2018 IVF  * UTI Urine cultures negative.  Patient has received Bactrim for 3 days and Rocephin for 1 day discontinue antibiotics    DISCHARGE CONDITIONS:   stable  CONSULTS OBTAINED:  Treatment Team:  Midge Minium, MD   PROCEDURES egd  DRUG ALLERGIES:   Allergies  Allergen Reactions  . Other Anaphylaxis    [place] nuts    DISCHARGE  MEDICATIONS:   Allergies as of 01/24/2018      Reactions   Other Anaphylaxis   [place] nuts      Medication List    TAKE these medications   acetaminophen 325 MG tablet Commonly known as:  TYLENOL Take 1 tablet (325 mg total) by mouth every 6 (six) hours as needed for mild pain or fever (or Fever >/= 101).   aspirin EC 81 MG tablet Take 81 mg by mouth daily.   calcium carbonate 1250 (500 Ca) MG tablet Commonly known as:  OS-CAL - dosed in mg of elemental calcium Take 1 tablet by mouth.   cholecalciferol 1000 units tablet Commonly known as:  VITAMIN D Take 1,000 Units by mouth daily.   cyanocobalamin 2000 MCG tablet Take 2,000 mcg by mouth daily.   cyclobenzaprine 5 MG tablet Commonly known as:  FLEXERIL Take 1 tablet (5 mg total) by mouth at bedtime.   ferrous sulfate 325 (65 FE) MG EC tablet Take 325 mg by mouth 3 (three) times daily with meals.   Krill Oil 300 MG Caps Take 1 capsule by mouth daily.   levothyroxine 50 MCG tablet Commonly known as:  SYNTHROID, LEVOTHROID Take 50 mcg by mouth daily before breakfast.   ondansetron 4 MG disintegrating tablet Commonly known as:  ZOFRAN-ODT Take 1 tablet (4 mg total) by mouth every 8 (eight) hours as needed for nausea or vomiting.   pantoprazole 40 MG tablet Commonly known as:  PROTONIX Take 1 tablet (40 mg total) by mouth 2 (two) times daily.   polyethylene glycol packet Commonly known as:  MIRALAX / GLYCOLAX Take  17 g by mouth daily as needed for mild constipation.   promethazine 12.5 MG tablet Commonly known as:  PHENERGAN 1-2 tabs po q 8 hours prn   promethazine 12.5 MG suppository Commonly known as:  PHENERGAN Place 1 suppository (12.5 mg total) rectally every 8 (eight) hours as needed for nausea or vomiting.   sucralfate 1 GM/10ML suspension Commonly known as:  CARAFATE Take 10 mLs (1 g total) by mouth every 6 (six) hours.   sulfamethoxazole-trimethoprim 800-160 MG tablet Commonly known as:  BACTRIM  DS,SEPTRA DS Take 1 tablet by mouth 2 (two) times daily.        DISCHARGE INSTRUCTIONS:   Follow-up with primary care physician in 1 week Follow-up with gastroenterology in 2 weeks  DIET:  Soft diet  DISCHARGE CONDITION:  Stable  ACTIVITY:  Activity as tolerated  OXYGEN:  Home Oxygen: No.   Oxygen Delivery: room air  DISCHARGE LOCATION:  home   If you experience worsening of your admission symptoms, develop shortness of breath, life threatening emergency, suicidal or homicidal thoughts you must seek medical attention immediately by calling 911 or calling your MD immediately  if symptoms less severe.  You Must read complete instructions/literature along with all the possible adverse reactions/side effects for all the Medicines you take and that have been prescribed to you. Take any new Medicines after you have completely understood and accpet all the possible adverse reactions/side effects.   Please note  You were cared for by a hospitalist during your hospital stay. If you have any questions about your discharge medications or the care you received while you were in the hospital after you are discharged, you can call the unit and asked to speak with the hospitalist on call if the hospitalist that took care of you is not available. Once you are discharged, your primary care physician will handle any further medical issues. Please note that NO REFILLS for any discharge medications will be authorized once you are discharged, as it is imperative that you return to your primary care physician (or establish a relationship with a primary care physician if you do not have one) for your aftercare needs so that they can reassess your need for medications and monitor your lab values.     Today  Chief Complaint  Patient presents with  . Emesis    Patient is doing fine.  Tolerating soft diet very well no vomiting wants to go home ROS:  CONSTITUTIONAL: Denies fevers, chills. Denies  any fatigue, weakness.  EYES: Denies blurry vision, double vision, eye pain. EARS, NOSE, THROAT: Denies tinnitus, ear pain, hearing loss. RESPIRATORY: Denies cough, wheeze, shortness of breath.  CARDIOVASCULAR: Denies chest pain, palpitations, edema.  GASTROINTESTINAL: Denies nausea, vomiting, diarrhea, abdominal pain. Denies bright red blood per rectum. GENITOURINARY: Denies dysuria, hematuria. ENDOCRINE: Denies nocturia or thyroid problems. HEMATOLOGIC AND LYMPHATIC: Denies easy bruising or bleeding. SKIN: Denies rash or lesion. MUSCULOSKELETAL: Denies pain in neck, back, shoulder, knees, hips or arthritic symptoms.  NEUROLOGIC: Denies paralysis, paresthesias.  PSYCHIATRIC: Denies anxiety or depressive symptoms.   VITAL SIGNS:  Blood pressure (!) 99/58, pulse (!) 57, temperature 98.2 F (36.8 C), temperature source Oral, resp. rate 18, height 5\' 3"  (1.6 m), weight 77.1 kg (170 lb), SpO2 100 %.  I/O:    Intake/Output Summary (Last 24 hours) at 01/24/2018 1121 Last data filed at 01/24/2018 1031 Gross per 24 hour  Intake 620 ml  Output 0 ml  Net 620 ml    PHYSICAL EXAMINATION:  GENERAL:  72 y.o.-year-old patient lying in the bed with no acute distress.  EYES: Pupils equal, round, reactive to light and accommodation. No scleral icterus. Extraocular muscles intact.  HEENT: Head atraumatic, normocephalic. Oropharynx and nasopharynx clear.  NECK:  Supple, no jugular venous distention. No thyroid enlargement, no tenderness.  LUNGS: Normal breath sounds bilaterally, no wheezing, rales,rhonchi or crepitation. No use of accessory muscles of respiration.  CARDIOVASCULAR: S1, S2 normal. No murmurs, rubs, or gallops.  ABDOMEN: Soft, non-tender, non-distended. Bowel sounds present. No organomegaly or mass.  EXTREMITIES: No pedal edema, cyanosis, or clubbing.  NEUROLOGIC: Cranial nerves II through XII are intact. Muscle strength 5/5 in all extremities. Sensation intact. Gait not checked.   PSYCHIATRIC: The patient is alert and oriented x 3.  SKIN: No obvious rash, lesion, or ulcer.   DATA REVIEW:   CBC Recent Labs  Lab 01/24/18 0753  WBC 6.0  HGB 12.4  HCT 37.6  PLT 308    Chemistries  Recent Labs  Lab 01/20/18 1007  01/24/18 0752  NA 136   < > 141  K 4.7   < > 3.5  CL 98*   < > 106  CO2 26   < > 26  GLUCOSE 125*   < > 95  BUN 28*   < > 10  CREATININE 1.46*   < > 0.86  CALCIUM 9.4   < > 8.6*  AST 22  --   --   ALT 11*  --   --   ALKPHOS 66  --   --   BILITOT 1.2  --   --    < > = values in this interval not displayed.    Cardiac Enzymes No results for input(s): TROPONINI in the last 168 hours.  Microbiology Results  Results for orders placed or performed during the hospital encounter of 01/20/18  Urine Culture     Status: None   Collection Time: 01/20/18  5:00 PM  Result Value Ref Range Status   Specimen Description   Final    URINE, RANDOM Performed at [facility], [address] [place], [place] [address]    Special Requests   Final    Normal Performed at [facility], [address] [place], [place] [address]    Culture   Final    NO GROWTH Performed at [facility], [address] [place], [place] [address]    Report Status 01/22/2018 FINAL  Final    RADIOLOGY:  Ct Abdomen Pelvis W Contrast  Result Date: 01/21/2018 CLINICAL DATA:  Nausea and vomiting and abdominal pain EXAM: CT ABDOMEN AND PELVIS WITH CONTRAST TECHNIQUE: Multidetector CT imaging of the abdomen and pelvis was performed using the standard protocol following bolus administration of intravenous contrast. CONTRAST:  100mL ISOVUE-300 IOPAMIDOL (ISOVUE-300) INJECTION 61% COMPARISON:  01/17/18 FINDINGS: Lower chest: No acute abnormality. Hepatobiliary: Liver is within normal limits. The gallbladder is well distended although no gallstones or pericholecystic fluid is noted. Pancreas: Unremarkable. No pancreatic ductal dilatation or surrounding  inflammatory changes. Spleen: Normal in size without focal abnormality. Adrenals/Urinary Tract: Adrenal glands are within normal limits. Bilateral extrarenal pelves are seen. No renal calculi or obstructive changes are noted. Bladder is partially distended. Stomach/Bowel: Stomach shows postsurgical changes consistent with gastric bypass. Considerable ingested food stuffs are noted within the gastric lumen. No obstructive or inflammatory changes are noted. Appendix appears normal. No evidence of bowel wall thickening, distention, or inflammatory changes. Vascular/Lymphatic: Aortic atherosclerosis. No enlarged abdominal or pelvic lymph  nodes. Reproductive: Uterus and bilateral adnexa are unremarkable. Other: No abdominal wall hernia or abnormality. No abdominopelvic ascites. Musculoskeletal: Degenerative changes of the lumbar spine are noted. IMPRESSION: Chronic changes without acute abnormality. Electronically Signed   By: Alcide Clever M.D.   On: 01/21/2018 18:29    EKG:   Orders placed or performed during the hospital encounter of 05/13/17  . EKG 12-Lead  . EKG 12-Lead  . ED EKG  . ED EKG      Management plans discussed with the patient, family and they are in agreement.  CODE STATUS:     Code Status Orders  (From admission, onward)        Start     Ordered   01/20/18 1445  Full code  Continuous     01/20/18 1444    Code Status History    Date Active Date Inactive Code Status Order ID Comments User Context   This patient has a current code status but no historical code status.      TOTAL TIME TAKING CARE OF THIS PATIENT: 43 minutes.   Note: This dictation was prepared with Dragon dictation along with smaller phrase technology. Any transcriptional errors that result from this process are unintentional.   @[facility] @  on 01/24/2018 at 11:21 AM  Between 7am to 6pm - Pager - [phone]  After 6pm go to [email or web address] - password [email or web address] [facility]  [place] [facility]  Office   [phone]  CC: Primary care physician; Raynelle Bring

## 2018-01-24 NOTE — Discharge Instructions (Signed)
Follow-up with primary care physician in 1 week Follow-up with gastroenterology in 2 weeks

## 2018-01-24 NOTE — Progress Notes (Signed)
Discharge instructions along with home medications and follow up gone over with patient. She verbalized that she understood instructions. 3 prescriptions given to patient. IV removed. Pt being discharged home on room air, no distress noted. Rifka Ramey S Fenton, RN 

## 2018-01-30 ENCOUNTER — Telehealth: Payer: Self-pay

## 2018-01-30 NOTE — Telephone Encounter (Signed)
Received message from patient stating she had received a call from [phone] and was returning the call.  I called and spoke with patient to let her know that the call she received was an automated call checking on her.  I asked if she had any questions or concerns about her recent discharge and she said she didn't.  I thanked her for her time and made her aware that she might receive one more automated call checking on her a final time.

## 2018-02-18 ENCOUNTER — Ambulatory Visit: Payer: Medicare Other | Admitting: Gastroenterology

## 2018-02-18 ENCOUNTER — Other Ambulatory Visit: Payer: Self-pay

## 2018-02-18 ENCOUNTER — Encounter: Payer: Self-pay | Admitting: Gastroenterology

## 2018-02-18 ENCOUNTER — Encounter (INDEPENDENT_AMBULATORY_CARE_PROVIDER_SITE_OTHER): Payer: Self-pay

## 2018-02-18 DIAGNOSIS — E669 Obesity, unspecified: Secondary | ICD-10-CM | POA: Insufficient documentation

## 2018-02-18 DIAGNOSIS — K289 Gastrojejunal ulcer, unspecified as acute or chronic, without hemorrhage or perforation: Secondary | ICD-10-CM

## 2018-02-18 DIAGNOSIS — M109 Gout, unspecified: Secondary | ICD-10-CM | POA: Insufficient documentation

## 2018-02-18 DIAGNOSIS — M170 Bilateral primary osteoarthritis of knee: Secondary | ICD-10-CM | POA: Insufficient documentation

## 2018-02-18 DIAGNOSIS — I10 Essential (primary) hypertension: Secondary | ICD-10-CM | POA: Insufficient documentation

## 2018-02-18 DIAGNOSIS — M8580 Other specified disorders of bone density and structure, unspecified site: Secondary | ICD-10-CM | POA: Insufficient documentation

## 2018-02-18 DIAGNOSIS — E782 Mixed hyperlipidemia: Secondary | ICD-10-CM | POA: Insufficient documentation

## 2018-02-18 DIAGNOSIS — M8660 Other chronic osteomyelitis, unspecified site: Secondary | ICD-10-CM | POA: Insufficient documentation

## 2018-02-18 MED ORDER — PANTOPRAZOLE SODIUM 40 MG PO TBEC: 40.0000 mg | DELAYED_RELEASE_TABLET | Freq: Every day | ORAL | 6 refills | Status: AC

## 2018-02-18 NOTE — Patient Instructions (Signed)
You have been given a prescription for Pantoprazole 40mg . You have been instructed to take once daily.

## 2018-02-18 NOTE — Progress Notes (Signed)
Primary Care Physician: Raynelle Bring  Primary Gastroenterologist:  Dr. Midge Minium  Chief Complaint  Patient presents with  . Rectal Pain    HPI: Tracie Gibson is a 72 y.o. female here for follow-up after being in the hospital with nausea and vomiting.  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  The patient was also found to have an anastomotic ulcer. The patient reports that she has no further nausea or vomiting or any sign of GI bleeding.  She has been doing well without complaints.  Current Outpatient Medications  Medication Sig Dispense Refill  . acetaminophen (TYLENOL) 325 MG tablet Take 1 tablet (325 mg total) by mouth every 6 (six) hours as needed for mild pain or fever (or Fever >/= 101).    . CALCIUM-VITAMIN D-VITAMIN K PO Take by mouth.    . cholecalciferol (VITAMIN D) 1000 units tablet Take 1,000 Units by mouth daily.    Marland Kitchen CINNAMON PO Take 1,000 mg by mouth.    . cyanocobalamin 2000 MCG tablet Take 2,000 mcg by mouth daily.    . Ginger, Zingiber officinalis, (GINGER PO) Take by mouth.    . levothyroxine (SYNTHROID, LEVOTHROID) 50 MCG tablet Take 50 mcg by mouth daily before breakfast.    . Misc Natural Products (TUMERSAID) TABS Take by mouth.    . pantoprazole (PROTONIX) 40 MG tablet Take 1 tablet (40 mg total) by mouth daily. 30 tablet 6  . aspirin EC 81 MG tablet Take 81 mg by mouth daily.    . calcium carbonate (OS-CAL - DOSED IN MG OF ELEMENTAL CALCIUM) 1250 (500 Ca) MG tablet Take 1 tablet by mouth.    . cyclobenzaprine (FLEXERIL) 5 MG tablet Take 1 tablet (5 mg total) by mouth at bedtime. (Patient not taking: Reported on 02/18/2018) 15 tablet 0  . ferrous sulfate 325 (65 FE) MG EC tablet Take 325 mg by mouth 3 (three) times daily with meals.    . ibandronate (BONIVA) 150 MG tablet ibandronate 150 mg tablet    . Krill Oil 300 MG CAPS Take 1 capsule by mouth daily.     . ondansetron (ZOFRAN-ODT) 4 MG  disintegrating tablet Take 1 tablet (4 mg total) by mouth every 8 (eight) hours as needed for nausea or vomiting. (Patient not taking: Reported on 02/18/2018) 20 tablet 0  . polyethylene glycol (MIRALAX / GLYCOLAX) packet Take 17 g by mouth daily as needed for mild constipation. (Patient not taking: Reported on 02/18/2018) 14 each 0  . promethazine (PHENERGAN) 12.5 MG suppository Place 1 suppository (12.5 mg total) rectally every 8 (eight) hours as needed for nausea or vomiting. (Patient not taking: Reported on 02/18/2018) 4 suppository 0  . promethazine (PHENERGAN) 12.5 MG tablet 1-2 tabs po q 8 hours prn (Patient not taking: Reported on 02/18/2018) 20 tablet 0  . sucralfate (CARAFATE) 1 GM/10ML suspension Take 10 mLs (1 g total) by mouth every 6 (six) hours. (Patient not taking: Reported on 02/18/2018) 420 mL 1   No current facility-administered medications for this visit.     Allergies as of 02/18/2018 - Review Complete 02/18/2018  Allergen Reaction Noted  . Other Anaphylaxis 01/17/2018    ROS:  General: Negative for anorexia, weight loss, fever, chills, fatigue, weakness. ENT: Negative for hoarseness, difficulty swallowing , nasal congestion. CV: Negative for chest pain, angina, palpitations, dyspnea on exertion, peripheral edema.  Respiratory: Negative for dyspnea at rest, dyspnea on exertion, cough, sputum, wheezing.  GI: See history of present illness. GU:  Negative for dysuria, hematuria, urinary incontinence, urinary frequency, nocturnal urination.  Endo: Negative for unusual weight change.    Physical Examination:   There were no vitals taken for this visit.  General: Well-nourished, well-developed in no acute distress.  Eyes: No icterus. Conjunctivae pink. Mouth: Oropharyngeal mucosa moist and pink , no lesions erythema or exudate. Lungs: Clear to auscultation bilaterally. Non-labored. Heart: Regular rate and rhythm, no murmurs rubs or gallops.  Abdomen: Bowel sounds are normal,  nontender, nondistended, no hepatosplenomegaly or masses, no abdominal bruits or hernia , no rebound or guarding.   Extremities: No lower extremity edema. No clubbing or deformities. Neuro: Alert and oriented x 3.  Grossly intact. Skin: Warm and dry, no jaundice.   Psych: Alert and cooperative, normal mood and affect.  Labs:    Imaging Studies: Ct Abdomen Pelvis W Contrast  Result Date: 01/21/2018 CLINICAL DATA:  Nausea and vomiting and abdominal pain EXAM: CT ABDOMEN AND PELVIS WITH CONTRAST TECHNIQUE: Multidetector CT imaging of the abdomen and pelvis was performed using the standard protocol following bolus administration of intravenous contrast. CONTRAST:  100mL ISOVUE-300 IOPAMIDOL (ISOVUE-300) INJECTION 61% COMPARISON:  01/17/18 FINDINGS: Lower chest: No acute abnormality. Hepatobiliary: Liver is within normal limits. The gallbladder is well distended although no gallstones or pericholecystic fluid is noted. Pancreas: Unremarkable. No pancreatic ductal dilatation or surrounding inflammatory changes. Spleen: Normal in size without focal abnormality. Adrenals/Urinary Tract: Adrenal glands are within normal limits. Bilateral extrarenal pelves are seen. No renal calculi or obstructive changes are noted. Bladder is partially distended. Stomach/Bowel: Stomach shows postsurgical changes consistent with gastric bypass. Considerable ingested food stuffs are noted within the gastric lumen. No obstructive or inflammatory changes are noted. Appendix appears normal. No evidence of bowel wall thickening, distention, or inflammatory changes. Vascular/Lymphatic: Aortic atherosclerosis. No enlarged abdominal or pelvic lymph nodes. Reproductive: Uterus and bilateral adnexa are unremarkable. Other: No abdominal wall hernia or abnormality. No abdominopelvic ascites. Musculoskeletal: Degenerative changes of the lumbar spine are noted. IMPRESSION: Chronic changes without acute abnormality. Electronically Signed   By: Alcide CleverMark   Lukens M.D.   On: 01/21/2018 18:29    Assessment and Plan:   Tracie Gibson is a 72 y.o. y/o female who comes in for follow-up after being in the hospital for nausea vomiting.  The patient had dilation of her anastomosis at the time of the EGD.  The patient has been doing well without any complaints. She reports that she is due for another colonoscopy in 2020 due to a history of adenomatous polyps with her last colonoscopy in 2015. The patient will follow up as needed.    Midge Miniumarren Lekisha Mcghee, MD. Clementeen GrahamFACG   Note: This dictation was prepared with Dragon dictation along with smaller phrase technology. Any transcriptional errors that result from this process are unintentional.

## 2018-03-20 ENCOUNTER — Other Ambulatory Visit: Payer: Self-pay | Admitting: Gastroenterology

## 2018-11-18 ENCOUNTER — Other Ambulatory Visit: Payer: Self-pay | Admitting: Nurse Practitioner

## 2018-11-18 DIAGNOSIS — Z1231 Encounter for screening mammogram for malignant neoplasm of breast: Secondary | ICD-10-CM

## 2018-12-11 ENCOUNTER — Ambulatory Visit
Admission: RE | Admit: 2018-12-11 | Discharge: 2018-12-11 | Disposition: A | Discharge status: Home / Self Care | Payer: Medicare Other | Source: Ambulatory Visit | Attending: Nurse Practitioner | Admitting: Nurse Practitioner

## 2018-12-11 DIAGNOSIS — Z1231 Encounter for screening mammogram for malignant neoplasm of breast: Secondary | ICD-10-CM | POA: Insufficient documentation

## 2019-06-25 IMAGING — MG MM DIGITAL SCREENING BILAT W/ TOMO W/ CAD
8 of 13 series · 8 of 29 positions shown · non-contrast
Comparison: Previous exam(s).

CLINICAL DATA: Screening.

EXAM:
2D DIGITAL SCREENING BILATERAL MAMMOGRAM WITH 3D TOMO WITH CAD

[R CC (1 of 2)]
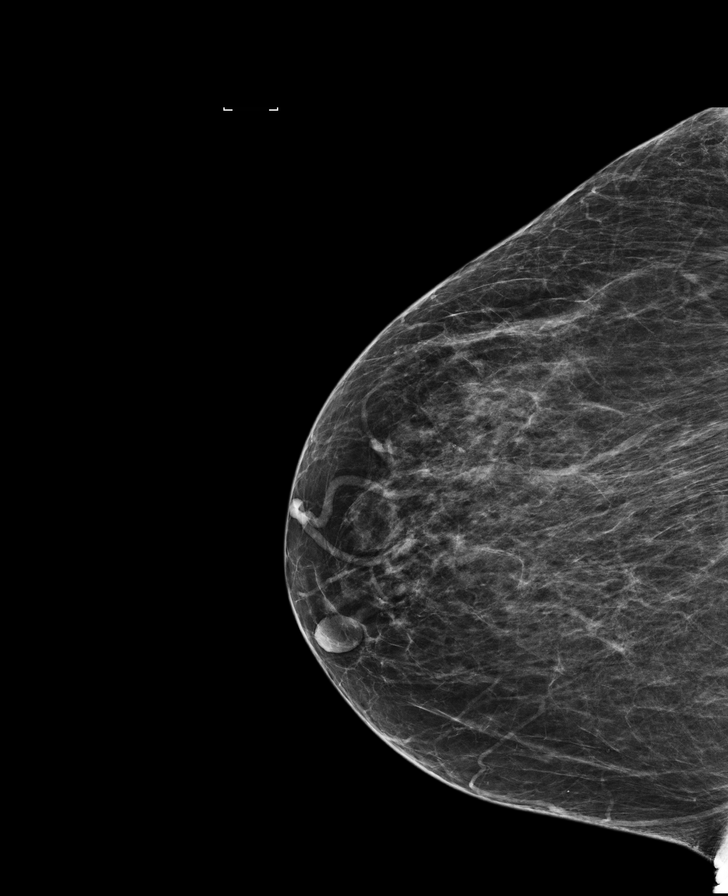

[L MLO synth-2D]
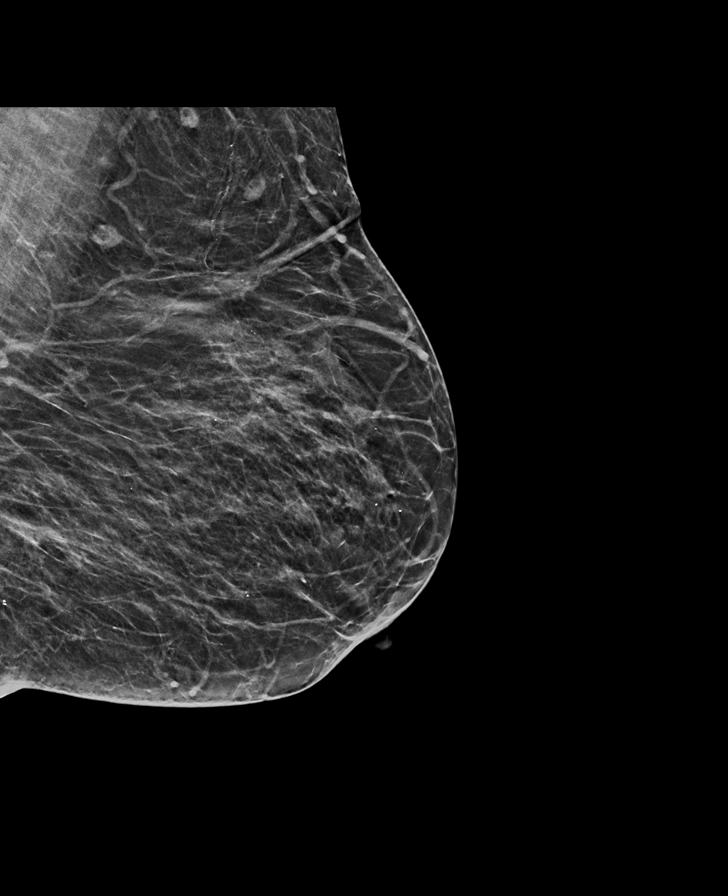

[L CC synth-2D]
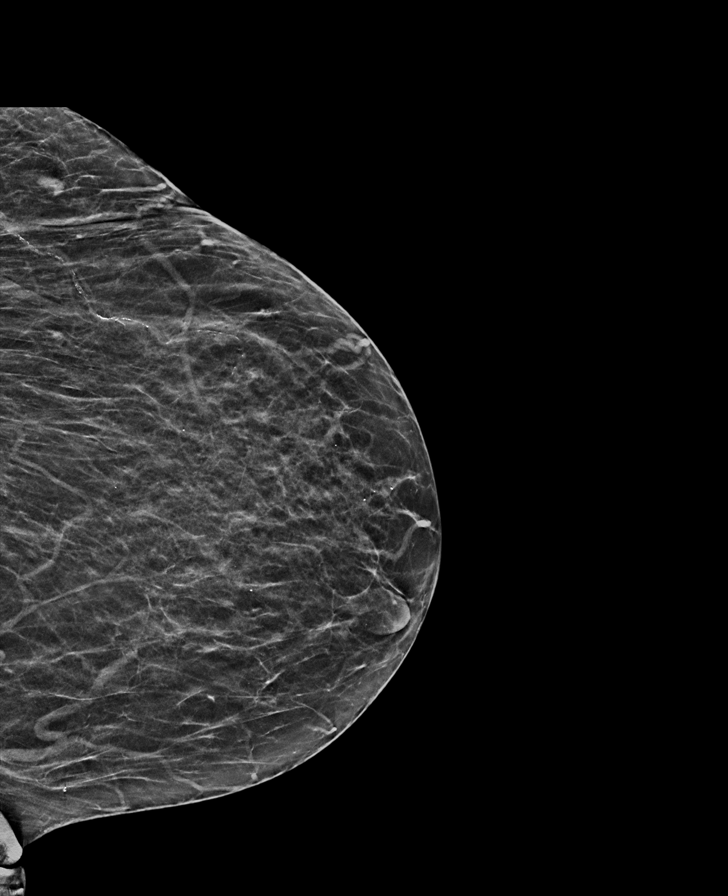

[R CC (2 of 2)]
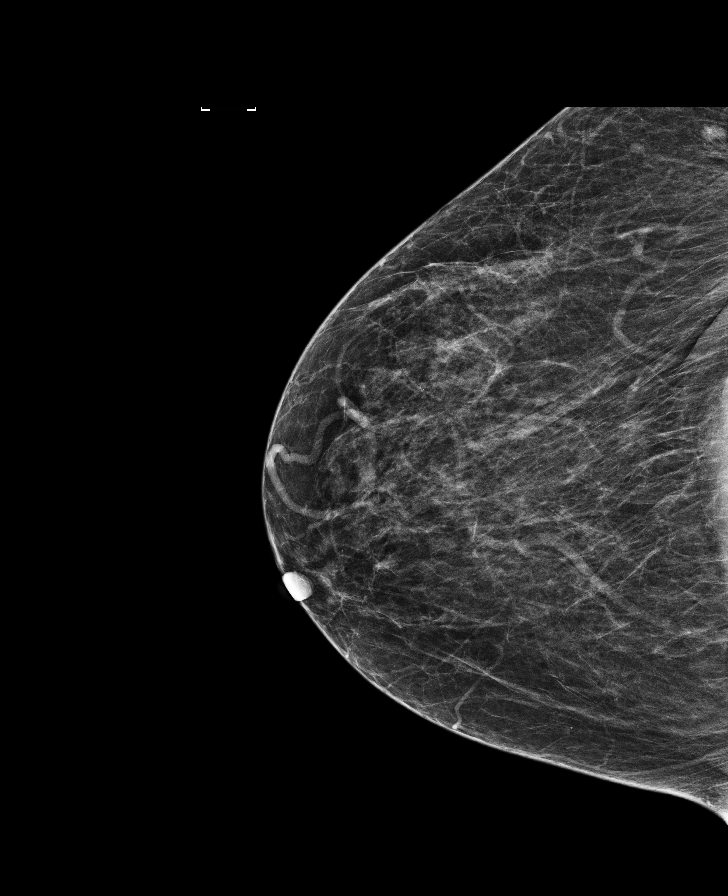

[L CC]
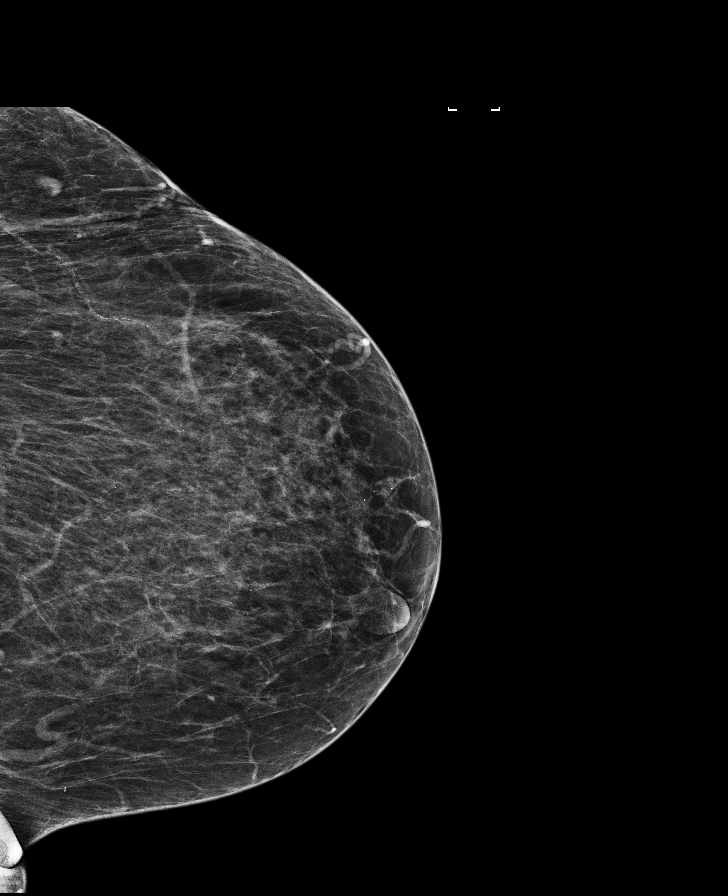

[R MLO synth-2D]
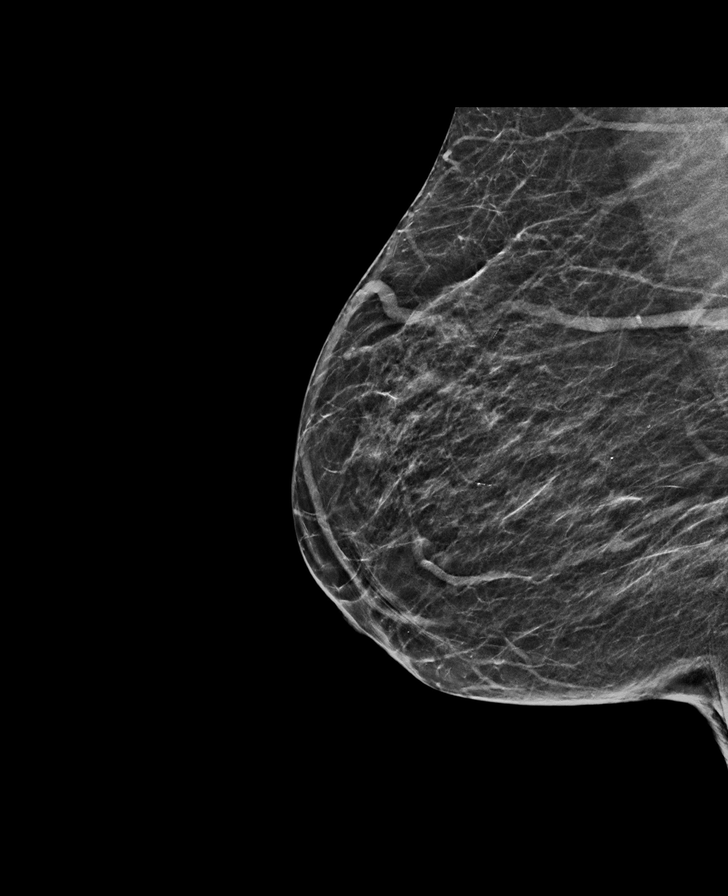

[R MLO]
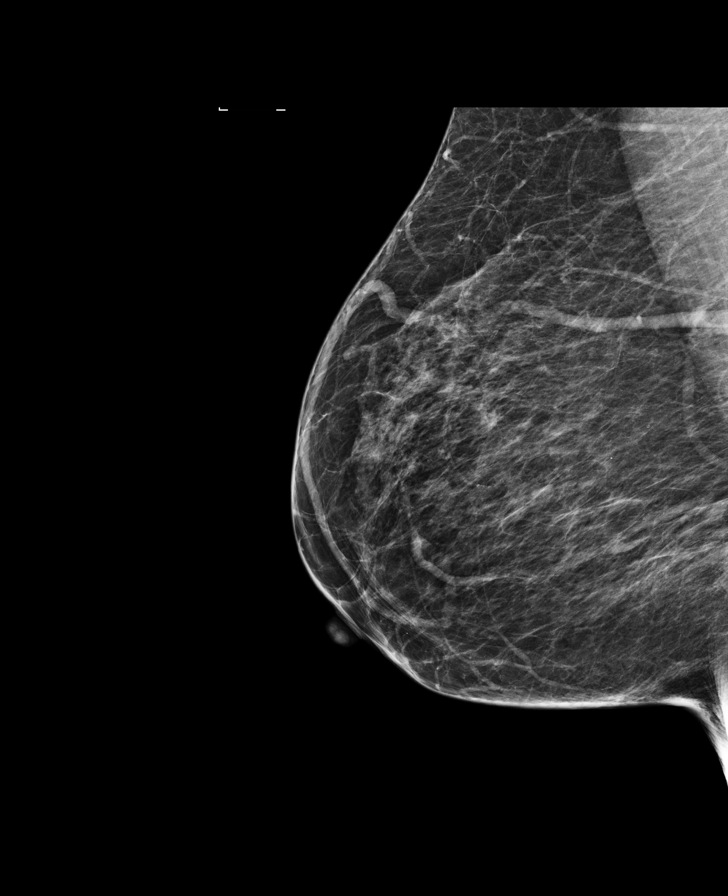

[L MLO]
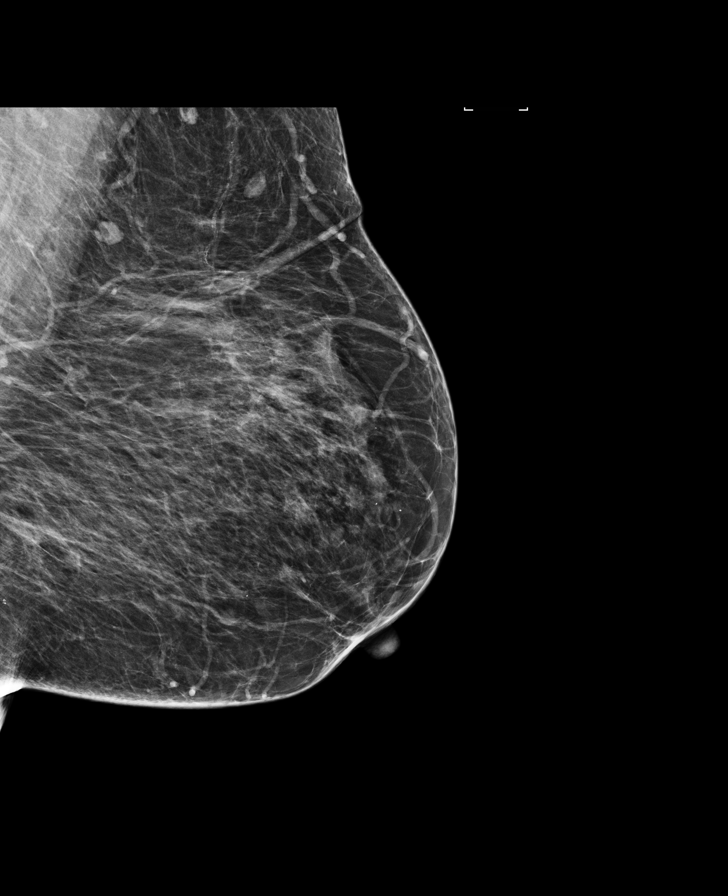

[8 of 29 positions shown; findings below may reference images not displayed]

ACR Breast Density Category b: There are scattered areas of
fibroglandular density.
FINDINGS: There are no findings suspicious for malignancy. Images were
processed with CAD.
IMPRESSION: No mammographic evidence of malignancy. A result letter of this
screening mammogram will be mailed directly to the patient.

RECOMMENDATION:
Screening mammogram in one year. (Code:[id number]-ZS0)

BI-RADS CATEGORY  1: Negative.

## 2019-11-18 ENCOUNTER — Other Ambulatory Visit: Payer: Self-pay | Admitting: Gerontology

## 2019-11-18 DIAGNOSIS — Z1231 Encounter for screening mammogram for malignant neoplasm of breast: Secondary | ICD-10-CM

## 2020-02-09 ENCOUNTER — Ambulatory Visit
Admission: RE | Admit: 2020-02-09 | Discharge: 2020-02-09 | Disposition: A | Discharge status: Home / Self Care | Payer: Medicare Other | Source: Ambulatory Visit | Attending: Gerontology | Admitting: Gerontology

## 2020-02-09 DIAGNOSIS — Z1231 Encounter for screening mammogram for malignant neoplasm of breast: Secondary | ICD-10-CM | POA: Diagnosis present

## 2020-12-30 ENCOUNTER — Other Ambulatory Visit: Payer: Self-pay | Admitting: Gerontology

## 2020-12-30 DIAGNOSIS — Z1231 Encounter for screening mammogram for malignant neoplasm of breast: Secondary | ICD-10-CM

## 2021-02-09 ENCOUNTER — Ambulatory Visit
Admission: RE | Admit: 2021-02-09 | Discharge: 2021-02-09 | Disposition: A | Discharge status: Home / Self Care | Payer: Medicare Other | Source: Ambulatory Visit | Attending: Gerontology | Admitting: Gerontology

## 2021-02-09 ENCOUNTER — Other Ambulatory Visit: Payer: Self-pay

## 2021-02-09 DIAGNOSIS — Z1231 Encounter for screening mammogram for malignant neoplasm of breast: Secondary | ICD-10-CM

## 2022-01-02 ENCOUNTER — Other Ambulatory Visit: Payer: Self-pay | Admitting: Gerontology

## 2022-01-02 DIAGNOSIS — Z1231 Encounter for screening mammogram for malignant neoplasm of breast: Secondary | ICD-10-CM

## 2022-02-22 ENCOUNTER — Ambulatory Visit: Payer: Medicare Other

## 2022-04-04 ENCOUNTER — Ambulatory Visit
Admission: RE | Admit: 2022-04-04 | Discharge: 2022-04-04 | Disposition: A | Discharge status: Home / Self Care | Payer: Medicare Other | Source: Ambulatory Visit | Attending: Gerontology | Admitting: Gerontology

## 2022-04-04 DIAGNOSIS — Z1231 Encounter for screening mammogram for malignant neoplasm of breast: Secondary | ICD-10-CM | POA: Insufficient documentation

## 2023-01-30 ENCOUNTER — Ambulatory Visit
Admission: EM | Admit: 2023-01-30 | Discharge: 2023-01-30 | Disposition: A | Discharge status: Home / Self Care | Payer: Medicare Other | Attending: Family Medicine | Admitting: Family Medicine

## 2023-01-30 DIAGNOSIS — Z20822 Contact with and (suspected) exposure to covid-19: Secondary | ICD-10-CM | POA: Diagnosis present

## 2023-01-30 DIAGNOSIS — J069 Acute upper respiratory infection, unspecified: Secondary | ICD-10-CM

## 2023-01-30 LAB — RESP PANEL BY RT-PCR (RSV, FLU A&B, COVID)  RVPGX2
Influenza A by PCR: NEGATIVE
Influenza B by PCR: NEGATIVE
Resp Syncytial Virus by PCR: NEGATIVE
SARS Coronavirus 2 by RT PCR: NEGATIVE

## 2023-01-30 MED ORDER — IPRATROPIUM BROMIDE 0.06 % NA SOLN: 2.0000 | Freq: Four times a day (QID) | NASAL | 12 refills | Status: AC

## 2023-01-30 MED ORDER — PROMETHAZINE-DM 6.25-15 MG/5ML PO SYRP: 2.5000 mL | ORAL_SOLUTION | Freq: Four times a day (QID) | ORAL | 0 refills | Status: AC | PRN

## 2023-01-30 MED ORDER — BENZONATATE 100 MG PO CAPS: 100.0000 mg | ORAL_CAPSULE | Freq: Three times a day (TID) | ORAL | 0 refills | Status: AC

## 2023-01-30 NOTE — ED Triage Notes (Signed)
Pt c/o cough, congestion, SOB, headache x2days  Pt has taken Claritin but it has not helped.   Pt asks to be tested for covid or flu.

## 2023-01-30 NOTE — ED Provider Notes (Signed)
[facility]    CSN: [id number] Arrival date & time: 01/30/23  0957      History   Chief Complaint No chief complaint on file.   HPI ILYN Gibson is a 77 y.o. female.   HPI   Tracie Gibson presents for nasal congestion, cough that started 2 days ago. She has yellow nasal discharge, chills, vomiting after coughing. Feels short of breath due to the nasal congestion and coughing. Says she just feels tired.  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 She felt warm but didn't take her temperature.  Took Claritin didn't help her. No Tylenol or Motrin today.  Denies diarrhea or sore throat. Requests COVID and influenza testing.     Past Medical History:  Diagnosis Date   Osteomyelitis ([place])    left leg   Thyroid disease     Patient Active Problem List   Diagnosis Date Noted   Chronic osteomyelitis ([place]) 02/18/2018   Gout 02/18/2018   Hypertension 02/18/2018   Mixed hyperlipidemia 02/18/2018   Morbid obesity ([place]) 02/18/2018   Obesity (BMI 30-39.9) 02/18/2018   Osteoarthritis of both knees 02/18/2018   Osteopenia 02/18/2018   Intractable vomiting with nausea    Dehydration 01/20/2018   Urine test positive for microalbuminuria 07/12/2017   Strain of right shoulder 06/19/2017   Acquired hypothyroidism 10/17/2016   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      Home Medications    Prior to Admission medications   Medication Sig Start Date End Date Taking? Authorizing Provider  acetaminophen (TYLENOL) 325  MG tablet Take 1 tablet (325 mg total) by mouth every 6 (six) hours as needed for mild pain or fever (or Fever >/= 101). 01/24/18  Yes Gouru, Illene Silver, MD  benzonatate (TESSALON) 100 MG capsule Take 1 capsule (100 mg total) by mouth every 8 (eight) hours. 01/30/23  Yes Jehan Ranganathan, Ronnette Juniper, DO  cholecalciferol (VITAMIN D) 1000 units tablet Take 1,000 Units by mouth daily.   Yes [provider]  CINNAMON PO Take 1,000 mg by mouth.   Yes [provider]  cyanocobalamin 2000 MCG tablet Take 2,000 mcg by mouth daily.   Yes [provider]  ferrous sulfate 325 (65 FE) MG EC tablet Take 325 mg by mouth 3 (three) times daily with meals.   Yes [provider]  ipratropium (ATROVENT) 0.06 % nasal spray Place 2 sprays into both nostrils 4 (four) times daily. 01/30/23  Yes Pancho Rushing, DO  Krill Oil 300 MG CAPS Take 1 capsule by mouth daily.    Yes [provider]  levothyroxine (SYNTHROID, LEVOTHROID) 50 MCG tablet Take 50 mcg by mouth daily before breakfast.   Yes [provider]  lisinopril (ZESTRIL) 2.5 MG tablet Take 2.5 mg by mouth daily.   Yes [provider]  Misc Natural Products (TUMERSAID) TABS Take by mouth.   Yes [provider]  promethazine-dextromethorphan (PROMETHAZINE-DM) 6.25-15 MG/5ML syrup Take 2.5-5 mLs by mouth 4 (four) times daily as needed. 01/30/23  Yes Lyndee Hensen,  DO  aspirin EC 81 MG tablet Take 81 mg by mouth daily.    [provider]  calcium carbonate (OS-CAL - DOSED IN MG OF ELEMENTAL CALCIUM) 1250 (500 Ca) MG tablet Take 1 tablet by mouth.    [provider]  CALCIUM-VITAMIN D-VITAMIN K PO Take by mouth.    [provider]  cyclobenzaprine (FLEXERIL) 5 MG tablet Take 1 tablet (5 mg total) by mouth at bedtime. Patient not taking: Reported on 02/18/2018 05/13/17   Norval Gable, MD  Ginger, Zingiber officinalis, (GINGER PO) Take by mouth.    [provider]  ibandronate (BONIVA)  150 MG tablet ibandronate 150 mg tablet    [provider]  ondansetron (ZOFRAN-ODT) 4 MG disintegrating tablet Take 1 tablet (4 mg total) by mouth every 8 (eight) hours as needed for nausea or vomiting. Patient not taking: Reported on 02/18/2018 01/15/18   Coral Spikes, DO  pantoprazole (PROTONIX) 40 MG tablet Take 1 tablet (40 mg total) by mouth daily. 02/18/18   Lucilla Lame, MD  polyethylene glycol (MIRALAX / Floria Raveling) packet Take 17 g by mouth daily as needed for mild constipation. Patient not taking: Reported on 02/18/2018 01/24/18   Nicholes Mango, MD  promethazine (PHENERGAN) 12.5 MG suppository Place 1 suppository (12.5 mg total) rectally every 8 (eight) hours as needed for nausea or vomiting. Patient not taking: Reported on 02/18/2018 01/17/18   Norval Gable, MD  promethazine (PHENERGAN) 12.5 MG tablet 1-2 tabs po q 8 hours prn Patient not taking: Reported on 02/18/2018 01/17/18   Norval Gable, MD  sucralfate (CARAFATE) 1 GM/10ML suspension Take 10 mLs (1 g total) by mouth every 6 (six) hours. Patient not taking: Reported on 02/18/2018 01/24/18   Nicholes Mango, MD    Family History Family History  Problem Relation Age of Onset   Arthritis Mother    Alzheimer's disease Father    Breast cancer Neg Hx     Social History Social History   Tobacco Use   Smoking status: Never   Smokeless tobacco: Never  Vaping Use   Vaping Use: Never used  Substance Use Topics   Alcohol use: No   Drug use: No     Allergies   Other   Review of Systems Review of Systems: negative unless otherwise stated in HPI.      Physical Exam Triage Vital Signs ED Triage Vitals  Enc Vitals Group     BP 01/30/23 1223 101/68     Pulse Rate 01/30/23 1223 79     Resp --      Temp 01/30/23 1223 99.6 F (37.6 C)     Temp Source 01/30/23 1223 Oral     SpO2 01/30/23 1223 95 %     Weight 01/30/23 1221 172 lb (78 kg)     Height 01/30/23 1221 5\' 2"  (1.575 m)     Head Circumference --      Peak Flow --       Pain Score 01/30/23 1221 5     Pain Loc --      Pain Edu? --      Excl. in [place]? --    No data found.  Updated Vital Signs BP 101/68 (BP Location: Left Arm)   Pulse 79   Temp 99.6 F (37.6 C) (Oral)   Ht 5\' 2"  (1.575 m)   Wt 78 kg   SpO2 95%   BMI 31.46 kg/m   Visual Acuity Right Eye Distance:   Left Eye Distance:  Bilateral Distance:    Right Eye Near:   Left Eye Near:    Bilateral Near:     Physical Exam GEN:     alert, ill but non-toxic appearing female in no distress    HENT:  mucus membranes moist, oropharyngeal without lesions or exudate, no tonsillar hypertrophy,  mild oropharyngeal erythema,  moderate erythematous edematous turbinates, clear nasal discharge EYES:   wears glasses, no scleral injection or discharge NECK:  supple, no meningismus   RESP:  no increased work of breathing, clear to auscultation bilaterally CVS:   regular rate and rhythm Skin:   warm and dry, no rash on visible skin    UC Treatments / Results  Labs (all labs ordered are listed, but only abnormal results are displayed) Labs Reviewed  RESP PANEL BY RT-PCR (RSV, FLU A&B, COVID)  [id number]    EKG   Radiology No results found.  Procedures Procedures (including critical care time)  Medications Ordered in UC Medications - No data to display  Initial Impression / Assessment and Plan / UC Course  I have reviewed the triage vital signs and the nursing notes.  Pertinent labs & imaging results that were available during my care of the patient were reviewed by me and considered in my medical decision making (see chart for details).       Pt is a 77 y.o. female who presents for 2 days of respiratory symptoms. Tracie Gibson is afebrile here without recent antipyretics. Satting well on room air. Overall pt is ill ut non-toxic appearing, well hydrated, without respiratory distress. Pulmonary exam is unremarkable.  COVID and influenza testing obtained and were negative. History consistent  with viral respiratory illness. Discussed symptomatic treatment.  Explained lack of efficacy of antibiotics in viral disease.  Typical duration of symptoms discussed.   Atrovent nasal spray for nasal congestion.  Return and ED precautions given and voiced understanding. Discussed MDM, treatment plan and plan for follow-up with patient who agrees with plan.     Final Clinical Impressions(s) / UC Diagnoses   Final diagnoses:  Viral URI with cough  Encounter for laboratory testing for COVID-19 virus     Discharge Instructions      We will contact you if your COVID/influenza/RSV test is positive.  Please quarantine while you wait for the results.  If your test is negative you may resume normal activities.  If your test is positive please continue to quarantine for at least 5 days from your symptom onset or until you are without a fever for at least 24 hours after the medications.    If your were prescribed medication, stop by the pharmacy to pick them up.   You can take Tylenol and/or Ibuprofen as needed for fever reduction and pain relief.    For cough: honey 1/2 to 1 teaspoon (you can dilute the honey in water or another fluid).  You can also use guaifenesin and dextromethorphan for cough. You can use a humidifier for chest congestion and cough.  If you don't have a humidifier, you can sit in the bathroom with the hot shower running.      For sore throat: try warm salt water gargles, Mucinex sore throat cough drops or cepacol lozenges, throat spray, warm tea or water with lemon/honey, popsicles or ice, or OTC cold relief medicine for throat discomfort. You can also purchase chloraseptic spray at the pharmacy or dollar store.   For congestion: take a daily anti-histamine like Zyrtec, Claritin, and a oral decongestant, such as  pseudoephedrine.  You can also use Flonase 1-2 sprays in each nostril daily. Afrin is also a good option, if you do not have high blood pressure.    It is important to  stay hydrated: drink plenty of fluids (water, gatorade/powerade/pedialyte, juices, or teas) to keep your throat moisturized and help further relieve irritation/discomfort.    Return or go to the Emergency Department if symptoms worsen or do not improve in the next few days      ED Prescriptions     Medication Sig Dispense Auth. Provider   promethazine-dextromethorphan (PROMETHAZINE-DM) 6.25-15 MG/5ML syrup Take 2.5-5 mLs by mouth 4 (four) times daily as needed. 118 mL Keyvin Rison, DO   ipratropium (ATROVENT) 0.06 % nasal spray Place 2 sprays into both nostrils 4 (four) times daily. 15 mL Lynton Crescenzo, DO   benzonatate (TESSALON) 100 MG capsule Take 1 capsule (100 mg total) by mouth every 8 (eight) hours. 21 capsule Lyndee Hensen, DO      PDMP not reviewed this encounter.   Lyndee Hensen, DO 01/30/23 1406

## 2023-01-30 NOTE — Discharge Instructions (Addendum)
We will contact you if your COVID/influenza/RSV test is positive.  Please quarantine while you wait for the results.  If your test is negative you may resume normal activities.  If your test is positive please continue to quarantine for at least 5 days from your symptom onset or until you are without a fever for at least 24 hours after the medications.    If your were prescribed medication, stop by the pharmacy to pick them up.   You can take Tylenol and/or Ibuprofen as needed for fever reduction and pain relief.    For cough: honey 1/2 to 1 teaspoon (you can dilute the honey in water or another fluid).  You can also use guaifenesin and dextromethorphan for cough. You can use a humidifier for chest congestion and cough.  If you don't have a humidifier, you can sit in the bathroom with the hot shower running.      For sore throat: try warm salt water gargles, Mucinex sore throat cough drops or cepacol lozenges, throat spray, warm tea or water with lemon/honey, popsicles or ice, or OTC cold relief medicine for throat discomfort. You can also purchase chloraseptic spray at the pharmacy or dollar store.   For congestion: take a daily anti-histamine like Zyrtec, Claritin, and a oral decongestant, such as pseudoephedrine.  You can also use Flonase 1-2 sprays in each nostril daily. Afrin is also a good option, if you do not have high blood pressure.    It is important to stay hydrated: drink plenty of fluids (water, gatorade/powerade/pedialyte, juices, or teas) to keep your throat moisturized and help further relieve irritation/discomfort.    Return or go to the Emergency Department if symptoms worsen or do not improve in the next few days  

## 2023-02-19 ENCOUNTER — Other Ambulatory Visit: Payer: Self-pay | Admitting: Gerontology

## 2023-02-19 DIAGNOSIS — Z1231 Encounter for screening mammogram for malignant neoplasm of breast: Secondary | ICD-10-CM

## 2023-04-10 ENCOUNTER — Ambulatory Visit
Admission: RE | Admit: 2023-04-10 | Discharge: 2023-04-10 | Disposition: A | Discharge status: Home / Self Care | Payer: Medicare Other | Source: Ambulatory Visit | Attending: Gerontology | Admitting: Gerontology

## 2023-04-10 DIAGNOSIS — Z1231 Encounter for screening mammogram for malignant neoplasm of breast: Secondary | ICD-10-CM | POA: Insufficient documentation

## 2024-02-12 ENCOUNTER — Other Ambulatory Visit: Payer: Self-pay | Admitting: Gerontology

## 2024-02-12 DIAGNOSIS — Z1231 Encounter for screening mammogram for malignant neoplasm of breast: Secondary | ICD-10-CM

## 2024-04-14 ENCOUNTER — Ambulatory Visit
Admission: RE | Admit: 2024-04-14 | Discharge: 2024-04-14 | Disposition: A | Discharge status: Home / Self Care | Source: Ambulatory Visit | Attending: Gerontology | Admitting: Gerontology

## 2024-04-14 DIAGNOSIS — Z1231 Encounter for screening mammogram for malignant neoplasm of breast: Secondary | ICD-10-CM | POA: Diagnosis present

## 2024-05-25 ENCOUNTER — Emergency Department
Admission: EM | Admit: 2024-05-25 | Discharge: 2024-05-25 | Disposition: A | Discharge status: Home / Self Care | Attending: Emergency Medicine | Admitting: Emergency Medicine

## 2024-05-25 ENCOUNTER — Other Ambulatory Visit: Payer: Self-pay

## 2024-05-25 DIAGNOSIS — S39012A Strain of muscle, fascia and tendon of lower back, initial encounter: Secondary | ICD-10-CM | POA: Insufficient documentation

## 2024-05-25 DIAGNOSIS — X58XXXA Exposure to other specified factors, initial encounter: Secondary | ICD-10-CM | POA: Insufficient documentation

## 2024-05-25 DIAGNOSIS — Z7982 Long term (current) use of aspirin: Secondary | ICD-10-CM | POA: Insufficient documentation

## 2024-05-25 DIAGNOSIS — M545 Low back pain, unspecified: Secondary | ICD-10-CM

## 2024-05-25 LAB — URINALYSIS, ROUTINE W REFLEX MICROSCOPIC
Bilirubin Urine: NEGATIVE
Glucose, UA: NEGATIVE mg/dL
Hgb urine dipstick: NEGATIVE
Ketones, ur: NEGATIVE mg/dL
Nitrite: NEGATIVE
Protein, ur: NEGATIVE mg/dL
Specific Gravity, Urine: 1.027 (ref 1.005–1.030)
pH: 5 (ref 5.0–8.0)

## 2024-05-25 MED ORDER — PREDNISONE 10 MG PO TABS: 10.0000 mg | ORAL_TABLET | Freq: Every day | ORAL | 0 refills | Status: AC

## 2024-05-25 NOTE — Discharge Instructions (Signed)
 Please continue with Celebrex and tramadol.  If your pain has continued to improve by tomorrow morning you may continue with rest and activity modification.  If symptoms persist start prednisone  taper.  Hold Celebrex while you are taking prednisone .  Return to the ER for any fevers, nausea, vomiting, worsening symptoms or any urgent changes in your health

## 2024-05-25 NOTE — ED Provider Notes (Signed)
 [place] EMERGENCY DEPARTMENT AT [facility] Provider Note   CSN: [id number] Arrival date & time: 05/25/24  1750     Patient presents with: Back Pain   Tracie Gibson is a 78 y.o. female with history of lumbar degenerative disc disease, spondylosis who is on Celebrex, Cymbalta, tramadol presents for evaluation of acute on chronic lower back pain.  Patient had recent x-rays at [facility] clinic showing degenerative changes of the thoracic and lumbar spine.  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  She describes some increased pain over the last few days but while getting up and ambulating here in the emergency department she has noticed some improvement of her pain.  No numbness tingling or radicular symptoms.  She denies any loss of bowel or bladder symptoms.  No abdominal pain chest pain or shortness of breath      Prior to Admission medications   Medication Sig Start Date End Date Taking? Authorizing Provider  predniSONE  (DELTASONE ) 10 MG tablet Take 1 tablet (10 mg total) by mouth daily. 6,5,4,3,2,1 six day taper 05/25/24  Yes Janiel Derhammer C, PA-C  acetaminophen  (TYLENOL ) 325 MG tablet Take 1 tablet (325 mg total) by mouth every 6 (six) hours as needed for mild pain or fever (or Fever >/= 101). 01/24/18   Gouru, Aruna, MD  aspirin  EC 81 MG tablet Take 81 mg by mouth daily.    [provider]  benzonatate  (TESSALON ) 100 MG capsule Take 1 capsule (100 mg total) by mouth every 8 (eight) hours. 01/30/23   Brimage, Vondra, DO  calcium  carbonate (OS-CAL - DOSED IN MG OF ELEMENTAL CALCIUM ) 1250 (500 Ca) MG tablet Take 1 tablet by mouth.    [provider]  CALCIUM -VITAMIN D -VITAMIN K PO Take by mouth.    [provider]  cholecalciferol  (VITAMIN D ) 1000 units tablet Take 1,000 Units by mouth daily.    [provider]  CINNAMON PO Take 1,000 mg by mouth.    [provider]  cyanocobalamin  2000 MCG tablet Take 2,000 mcg by mouth daily.     [provider]  cyclobenzaprine  (FLEXERIL ) 5 MG tablet Take 1 tablet (5 mg total) by mouth at bedtime. Patient not taking: Reported on 02/18/2018 05/13/17   Servando Hire, MD  ferrous sulfate  325 (65 FE) MG EC tablet Take 325 mg by mouth 3 (three) times daily with meals.    [provider]  Ginger, Zingiber officinalis, (GINGER PO) Take by mouth.    [provider]  ibandronate (BONIVA) 150 MG tablet ibandronate 150 mg tablet    [provider]  ipratropium (ATROVENT ) 0.06 % nasal spray Place 2 sprays into both nostrils 4 (four) times daily. 01/30/23   Brimage, Vondra, DO  Krill Oil 300 MG CAPS Take 1 capsule by mouth daily.     [provider]  levothyroxine  (SYNTHROID , LEVOTHROID) 50 MCG tablet Take 50 mcg by mouth daily before breakfast.    [provider]  lisinopril (ZESTRIL) 2.5 MG tablet Take 2.5 mg by mouth daily.    [provider]  Misc Natural Products (TUMERSAID) TABS Take by mouth.    [provider]  ondansetron  (ZOFRAN -ODT) 4 MG disintegrating tablet Take 1 tablet (4 mg total) by mouth every 8 (eight) hours as needed for nausea or vomiting. Patient not taking: Reported on 02/18/2018 01/15/18   Cook, Jayce G, DO  pantoprazole  (PROTONIX ) 40 MG tablet Take 1 tablet (40 mg total) by mouth daily. 02/18/18   Jinny Carmine,  MD  polyethylene glycol (MIRALAX  / GLYCOLAX ) packet Take 17 g by mouth daily as needed for mild constipation. Patient not taking: Reported on 02/18/2018 01/24/18   Gouru, Aruna, MD  promethazine  (PHENERGAN ) 12.5 MG suppository Place 1 suppository (12.5 mg total) rectally every 8 (eight) hours as needed for nausea or vomiting. Patient not taking: Reported on 02/18/2018 01/17/18   Servando Hire, MD  promethazine  (PHENERGAN ) 12.5 MG tablet 1-2 tabs po q 8 hours prn Patient not taking: Reported on 02/18/2018 01/17/18   Servando Hire, MD  promethazine -dextromethorphan (PROMETHAZINE -DM) 6.25-15 MG/5ML syrup Take 2.5-5 mLs  by mouth 4 (four) times daily as needed. 01/30/23   Brimage, Vondra, DO  sucralfate  (CARAFATE ) 1 GM/10ML suspension Take 10 mLs (1 g total) by mouth every 6 (six) hours. Patient not taking: Reported on 02/18/2018 01/24/18   Gouru, Aruna, MD    Allergies: Other    Review of Systems  Updated Vital Signs BP 114/75 (BP Location: Right Arm)   Pulse (!) 59   Temp 97.9 F (36.6 C) (Oral)   Resp 17   Ht 5' 2 (1.575 m)   Wt 71.7 kg   SpO2 100%   BMI 28.90 kg/m   Physical Exam Constitutional:      Appearance: She is well-developed.  HENT:     Head: Normocephalic and atraumatic.  Eyes:     Conjunctiva/sclera: Conjunctivae normal.  Cardiovascular:     Rate and Rhythm: Normal rate.  Pulmonary:     Effort: Pulmonary effort is normal. No respiratory distress.  Abdominal:     General: Abdomen is flat. There is no distension.     Palpations: Abdomen is soft.     Tenderness: There is no abdominal tenderness. There is no guarding.  Musculoskeletal:        General: Normal range of motion.     Cervical back: Normal range of motion.     Comments: No lumbar or thoracic spinous process tenderness.  She has mild right paravertebral muscle tenderness of the lower lumbar spine.  No sacral or SI joint tenderness.  No iliac crest tenderness.  She has normal range of motion of both hips with no discomfort and she is able to ambulate in the room with no assistive devices.  Neuro vas intact in bilateral lower extremities with no saddle anesthesia.  Good plantarflexion dorsiflexion and able to actively straight leg raise both knees  Skin:    General: Skin is warm.     Findings: No rash.  Neurological:     General: No focal deficit present.     Mental Status: She is alert and oriented to person, place, and time.     Cranial Nerves: No cranial nerve deficit.     Motor: No weakness.  Psychiatric:        Behavior: Behavior normal.        Thought Content: Thought content normal.     (all labs ordered  are listed, but only abnormal results are displayed) Labs Reviewed  URINALYSIS, ROUTINE W REFLEX MICROSCOPIC - Abnormal; Notable for the following components:      Result Value   Color, Urine YELLOW (*)    APPearance CLEAR (*)    Leukocytes,Ua TRACE (*)    Bacteria, UA RARE (*)    All other components within normal limits    EKG: None  Radiology: No results found.   Procedures   Medications Ordered in the ED - No data to display  Medical Decision Making Amount and/or Complexity of Data Reviewed Labs: ordered.  Risk Prescription drug management.   78 year old female with acute on chronic lower back pain.  Previous x-rays from [facility] reviewed by me today show thoracic and lumbar to space degeneration.  She has no weakness or neurological deficits.  No sciatica symptoms.  She denies any trauma or injury.  Symptoms have improved while waiting in the emergency department after taking Celebrex and tramadol.  She will continue to monitor symptoms and recommend heating pad and activity modification.  Recommend starting prednisone  taper tomorrow morning if symptoms persist.  She is given strict return precautions understands signs symptoms return to the ER for.  Patient's urinalysis negative for infection, no urinary symptoms on history.  Abdominal exam benign Final diagnoses:  Acute midline low back pain without sciatica  Strain of lumbar region, initial encounter    ED Discharge Orders          Ordered    predniSONE  (DELTASONE ) 10 MG tablet  Daily        05/25/24 2038               Charlene Debby BROCKS, PA-C 05/25/24 2045    Malvina Alm DASEN, MD 05/25/24 2210

## 2024-05-25 NOTE — ED Triage Notes (Signed)
 Back pain since Friday. Pt sts that she had xrays done today but the pain has not gotten any better.

## 2024-05-25 NOTE — ED Provider Triage Note (Signed)
 Emergency Medicine Provider Triage Evaluation Note  Tracie Gibson , a 78 y.o. female  was evaluated in triage.  Pt complains of back pain.  Patient states having back pain, that now radiates to her thighs or legs.  Patient states pain increased when she is getting up.  Patient denies urinary incontinence, fecal incontinence, urinary retention.  Patient denies urinary symptoms, fever.  Patient was seen today at [facility] clinic she has been taking pain medications without any relief.  Patient had today lumbar x-ray obtained in the clinic.  Review of Systems  Positive:  Negative:  Physical Exam  BP 114/75 (BP Location: Right Arm)   Pulse (!) 59   Temp 97.9 F (36.6 C) (Oral)   Resp 17   Ht 5' 2 (1.575 m)   Wt 71.7 kg   SpO2 100%   BMI 28.90 kg/m vital signs during triage: Normotensive, bradycardic Gen:   Awake, no distress   Resp:  Normal effort  MSK:   Moves extremities without difficulty  Other:    Medical Decision Making  Medically screening exam initiated at 5:59 PM.  Appropriate orders placed.  Tracie Gibson was informed that the remainder of the evaluation will be completed by another provider, this initial triage assessment does not replace that evaluation, and the importance of remaining in the ED until their evaluation is complete. Patient with history of lumbar back pain, patient had today lumbar x-ray at [facility] clinic.  I did not order any labs or imaging    Janit Kast, PA-C 05/25/24 1802

## 2024-05-25 NOTE — ED Notes (Signed)
 First nurse note: from [facility] for lower back pain, has lumbar stenosis. Prescribed meds not helping, pain worse since 3 days. No numbness, weakness, tingling or incontinence. VSS

## 2024-06-24 ENCOUNTER — Other Ambulatory Visit: Payer: Self-pay | Admitting: Orthopedic Surgery

## 2024-06-24 DIAGNOSIS — M4807 Spinal stenosis, lumbosacral region: Secondary | ICD-10-CM

## 2024-06-24 DIAGNOSIS — M5136 Other intervertebral disc degeneration, lumbar region with discogenic back pain only: Secondary | ICD-10-CM

## 2024-06-27 ENCOUNTER — Ambulatory Visit
Admission: RE | Admit: 2024-06-27 | Discharge: 2024-06-27 | Disposition: A | Discharge status: Home / Self Care | Source: Ambulatory Visit | Attending: Orthopedic Surgery | Admitting: Orthopedic Surgery

## 2024-06-27 DIAGNOSIS — M4807 Spinal stenosis, lumbosacral region: Secondary | ICD-10-CM

## 2024-06-27 DIAGNOSIS — M5136 Other intervertebral disc degeneration, lumbar region with discogenic back pain only: Secondary | ICD-10-CM
